# Patient Record
Sex: Female | Born: 1965 | Race: White | Hispanic: No | Marital: Married | Smoking: Never smoker
Health system: Southern US, Community
[De-identification: ages and names within clinical notes are randomized; demographics above are authoritative.]

## PROBLEM LIST (undated history)

## (undated) DIAGNOSIS — N8003 Adenomyosis of the uterus: Secondary | ICD-10-CM

## (undated) DIAGNOSIS — N800 Endometriosis of uterus: Secondary | ICD-10-CM

## (undated) DIAGNOSIS — E079 Disorder of thyroid, unspecified: Secondary | ICD-10-CM

## (undated) DIAGNOSIS — N809 Endometriosis, unspecified: Secondary | ICD-10-CM

## (undated) DIAGNOSIS — E785 Hyperlipidemia, unspecified: Secondary | ICD-10-CM

## (undated) DIAGNOSIS — T7840XA Allergy, unspecified, initial encounter: Secondary | ICD-10-CM

## (undated) HISTORY — DX: Adenomyosis of the uterus: N80.03

## (undated) HISTORY — DX: Allergy, unspecified, initial encounter: T78.40XA

## (undated) HISTORY — PX: LAPAROSCOPY: SHX197

## (undated) HISTORY — DX: Disorder of thyroid, unspecified: E07.9

## (undated) HISTORY — PX: OTHER SURGICAL HISTORY: SHX169

## (undated) HISTORY — DX: Endometriosis, unspecified: N80.9

## (undated) HISTORY — DX: Hyperlipidemia, unspecified: E78.5

## (undated) HISTORY — DX: Endometriosis of uterus: N80.0

## (undated) HISTORY — PX: VAGINAL HYSTERECTOMY: SUR661

## (undated) HISTORY — PX: BLADDER SUSPENSION: SHX72

---

## 2013-11-16 ENCOUNTER — Ambulatory Visit (INDEPENDENT_AMBULATORY_CARE_PROVIDER_SITE_OTHER): Payer: 59 | Admitting: Obstetrics & Gynecology

## 2013-11-16 ENCOUNTER — Encounter: Payer: Self-pay | Admitting: Obstetrics & Gynecology

## 2013-11-16 VITALS — BP 132/78 | HR 66 | Resp 16 | Ht 70.0 in | Wt 219.0 lb

## 2013-11-16 DIAGNOSIS — E063 Autoimmune thyroiditis: Secondary | ICD-10-CM

## 2013-11-16 DIAGNOSIS — Z01419 Encounter for gynecological examination (general) (routine) without abnormal findings: Secondary | ICD-10-CM

## 2013-11-16 DIAGNOSIS — Z0000 Encounter for general adult medical examination without abnormal findings: Secondary | ICD-10-CM

## 2013-11-16 NOTE — Progress Notes (Signed)
Subjective:    Katrina Clements is a 48 y.o. MW G2P2 (40 and 103  Daughters) female who presents for an annual exam. The patient has no complaints today. The patient is sexually active but not often due to vaginal dryness and some discomfort/abd pain after sex) GYN screening history: last pap: was normal. The patient wears seatbelts: yes. The patient participates in regular exercise: no. Has the patient ever been transfused or tattooed?: no. The patient reports that there is not domestic violence in her life.   Menstrual History: OB History   Grav Para Term Preterm Abortions TAB SAB Ect Mult Living   2 2 2       2       Menarche age: 46  No LMP recorded. Patient has had a hysterectomy.    The following portions of the patient's history were reviewed and updated as appropriate: allergies, current medications, past family history, past medical history, past social history, past surgical history and problem list.  Review of Systems A comprehensive review of systems was negative. Married for 25 years. Homemaker. She used the Viv[employer] dot for about 5 years post surgical menopause.   Objective:    BP 132/78  Pulse 66  Resp 16  Ht 5\' 10"  (1.778 m)  Wt 219 lb (99.338 kg)  BMI 31.42 kg/m2  General Appearance:    Alert, cooperative, no distress, appears stated age  Head:    Normocephalic, without obvious abnormality, atraumatic  Eyes:    PERRL, conjunctiva/corneas clear, EOM's intact, fundi    benign, both eyes  Ears:    Normal TM's and external ear canals, both ears  Nose:   Nares normal, septum midline, mucosa normal, no drainage    or sinus tenderness  Throat:   Lips, mucosa, and tongue normal; teeth and gums normal  Neck:   Supple, symmetrical, trachea midline, no adenopathy;    thyroid:  no enlargement/tenderness/nodules; no carotid   bruit or JVD  Back:     Symmetric, no curvature, ROM normal, no CVA tenderness  Lungs:     Clear to auscultation bilaterally, respirations unlabored  Chest  Wall:    No tenderness or deformity   Heart:    Regular rate and rhythm, S1 and S2 normal, no murmur, rub   or gallop  Breast Exam:    No tenderness, masses, or nipple abnormality  Abdomen:     Soft, non-tender, bowel sounds active all four quadrants,    no masses, no organomegaly  Genitalia:    Normal female without lesion, discharge or tenderness, normal cuff. Normal bimanual.     Extremities:   Extremities normal, atraumatic, no cyanosis or edema  Pulses:   2+ and symmetric all extremities  Skin:   Skin color, texture, turgor normal, no rashes or lesions  Lymph nodes:   Cervical, supraclavicular, and axillary nodes normal  Neurologic:   CNII-XII intact, normal strength, sensation and reflexes    throughout  .    Assessment:    Healthy female exam.    Plan:     Breast self exam technique reviewed and patient encouraged to perform self-exam monthly. Mammogram.  Fasting labs at her convenience

## 2013-11-24 ENCOUNTER — Other Ambulatory Visit: Payer: 59

## 2013-11-24 LAB — CBC
HEMATOCRIT: 38 % (ref 36.0–46.0)
HEMOGLOBIN: 12.5 g/dL (ref 12.0–15.0)
MCH: 28.1 pg (ref 26.0–34.0)
MCHC: 32.9 g/dL (ref 30.0–36.0)
MCV: 85.4 fL (ref 78.0–100.0)
Platelets: 264 10*3/uL (ref 150–400)
RBC: 4.45 MIL/uL (ref 3.87–5.11)
RDW: 14.2 % (ref 11.5–15.5)
WBC: 7.4 10*3/uL (ref 4.0–10.5)

## 2013-11-25 LAB — LIPID PANEL
CHOLESTEROL: 234 mg/dL — AB (ref 0–200)
HDL: 39 mg/dL — ABNORMAL LOW (ref >39)
LDL Cholesterol: 144 mg/dL — ABNORMAL HIGH (ref 0–99)
TRIGLYCERIDES: 257 mg/dL — AB (ref <150)
Total CHOL/HDL Ratio: 6 Ratio
VLDL: 51 mg/dL — ABNORMAL HIGH (ref 0–40)

## 2013-11-25 LAB — T4: T4, Total: 5.9 ug/dL (ref 5.0–12.5)

## 2013-11-25 LAB — T3, FREE: T3, Free: 3 pg/mL (ref 2.3–4.2)

## 2013-11-25 LAB — COMPREHENSIVE METABOLIC PANEL
ALT: 19 U/L (ref 0–35)
AST: 17 U/L (ref 0–37)
Albumin: 4 g/dL (ref 3.5–5.2)
Alkaline Phosphatase: 72 U/L (ref 39–117)
BILIRUBIN TOTAL: 0.4 mg/dL (ref 0.2–1.2)
BUN: 12 mg/dL (ref 6–23)
CO2: 27 meq/L (ref 19–32)
Calcium: 9 mg/dL (ref 8.4–10.5)
Chloride: 102 mEq/L (ref 96–112)
Creat: 0.74 mg/dL (ref 0.50–1.10)
GLUCOSE: 91 mg/dL (ref 70–99)
Potassium: 4.6 mEq/L (ref 3.5–5.3)
Sodium: 138 mEq/L (ref 135–145)
TOTAL PROTEIN: 7.1 g/dL (ref 6.0–8.3)

## 2013-11-25 LAB — TSH: TSH: 31.513 u[IU]/mL — AB (ref 0.350–4.500)

## 2013-11-27 ENCOUNTER — Telehealth: Payer: Self-pay | Admitting: *Deleted

## 2013-11-27 NOTE — Telephone Encounter (Signed)
Pt notified of labs results and patient will follow up with Dr. Ileene Rubens in [facility].  Copy mailed to pt.

## 2013-12-08 ENCOUNTER — Encounter: Payer: Self-pay | Admitting: Family Medicine

## 2013-12-08 ENCOUNTER — Ambulatory Visit (INDEPENDENT_AMBULATORY_CARE_PROVIDER_SITE_OTHER): Payer: 59 | Admitting: Family Medicine

## 2013-12-08 VITALS — BP 137/90 | HR 81 | Ht 70.0 in | Wt 222.0 lb

## 2013-12-08 DIAGNOSIS — Z23 Encounter for immunization: Secondary | ICD-10-CM

## 2013-12-08 DIAGNOSIS — E785 Hyperlipidemia, unspecified: Secondary | ICD-10-CM | POA: Insufficient documentation

## 2013-12-08 DIAGNOSIS — E038 Other specified hypothyroidism: Secondary | ICD-10-CM

## 2013-12-08 DIAGNOSIS — M797 Fibromyalgia: Secondary | ICD-10-CM | POA: Insufficient documentation

## 2013-12-08 DIAGNOSIS — E063 Autoimmune thyroiditis: Secondary | ICD-10-CM

## 2013-12-08 DIAGNOSIS — IMO0001 Reserved for inherently not codable concepts without codable children: Secondary | ICD-10-CM

## 2013-12-08 MED ORDER — LEVOTHYROXINE SODIUM 75 MCG PO TABS: 75.0000 ug | ORAL_TABLET | Freq: Every day | ORAL | Status: DC

## 2013-12-08 MED ORDER — PREGABALIN 75 MG PO CAPS: 75.0000 mg | ORAL_CAPSULE | Freq: Two times a day (BID) | ORAL | Status: DC

## 2013-12-08 NOTE — Patient Instructions (Signed)
Please call me if you would like a Rx for the Lyrica after trying the sample packs.

## 2013-12-08 NOTE — Progress Notes (Signed)
CC: Katrina Clements is a 48 y.o. female is here for Establish Care   Subjective: HPI:  Pleasant 48 year old here to establish care  Patient reports a history of Hashimoto's thyroiditis. She's been on Synthroid and a compound formulation of thyroid hormone. She's not been taking either for matter of months due to finances and the needs of her children. She endorses unintentional weight gain, fatigue, and diffuse muscle and skin pain described as tenderness and sensitivity that she attributes to her fibromyalgia. Recently had a TSH that was elevated with normal T4 and T3.  She recently had labs done reflecting hyperlipidemia. She's never been on cholesterol medication there is no formal exercise routine but she has cut out all fried foods in her diet over the past 3-6 months.  Reports a history of fibromyalgia which she has been unsuccessfully trying to control with a gluten-free diet but has never been on medications. Symptoms are worse first thing in the morning or after periods of inactivity. She denies joint pain but attributes the pain more to skin and muscles mostly in the upper extremities. Symptoms are mild-to-moderate in severity and slightly improved with Excedrin.  Review of Systems - General ROS: negative for - chills, fever, night sweats,weight loss Ophthalmic ROS: negative for - decreased vision Psychological ROS: negative for - anxiety or depression ENT ROS: negative for - hearing change, nasal congestion, tinnitus or allergies Hematological and Lymphatic ROS: negative for - bleeding problems, bruising or swollen lymph nodes Breast ROS: negative Respiratory ROS: no cough, shortness of breath, or wheezing Cardiovascular ROS: no chest pain or dyspnea on exertion Gastrointestinal ROS: no abdominal pain, change in bowel habits, or black or bloody stools Genito-Urinary ROS: negative for - genital discharge, genital ulcers, incontinence or abnormal bleeding from genitals Musculoskeletal  ROS: negative for - joint pain Neurological ROS: negative for - headaches or memory loss Dermatological ROS: negative for lumps, mole changes, rash and skin lesion changes Past Medical History  Diagnosis Date  . Hyperlipidemia   . Thyroid disease   . Allergy     seasonal  . Endometriosis   . Adenomyosis     Past Surgical History  Procedure Laterality Date  . Vaginal hysterectomy      total  . Laparoscopy      x 2  . Bladder suspension    . Btl     Family History  Problem Relation Age of Onset  . Heart attack Mother   . Rheum arthritis Mother   . Heart attack Maternal Grandmother   . Stroke Maternal Grandfather   . Heart attack Brother   . Hypertension Brother   . Diabetes Father   . Cancer Father     prostate  . Diabetes Sister     History   Social History  . Marital Status: Married    Spouse Name: N/A    Number of Children: N/A  . Years of Education: N/A   Occupational History  . Not on file.   Social History Main Topics  . Smoking status: Never Smoker   . Smokeless tobacco: Never Used  . Alcohol Use: Yes     Comment: very rarely  . Drug Use: No  . Sexual Activity: Yes    Partners: Male   Other Topics Concern  . Not on file   Social History Narrative  . No narrative on file     Objective: BP 137/90  Pulse 81  Ht 5\' 10"  (1.778 m)  Wt 222 lb (100.699 kg)  BMI 31.85 kg/m2  General: Alert and Oriented, No Acute Distress HEENT: Pupils equal, round, reactive to light. Conjunctivae clear.  Moist mucous membranes pharynx unremarkable Lungs: Clear to auscultation bilaterally, no wheezing/ronchi/rales.  Comfortable work of breathing. Good air movement. Cardiac: Regular rate and rhythm. Normal S1/S2.  No murmurs, rubs, nor gallops.   Abdomen: obese and soft Extremities: No peripheral edema.  Strong peripheral pulses.  Mental Status: No depression, anxiety, nor agitation. Skin: Warm and dry.  Assessment & Plan: Katrina Clements was seen today for establish  care.  Diagnoses and associated orders for this visit:  Hypothyroidism due to Hashimoto's thyroiditis - levothyroxine (SYNTHROID, LEVOTHROID) 75 MCG tablet; Take 1 tablet (75 mcg total) by mouth daily. - Flu Vaccine QUAD 36+ mos PF IM (Fluarix Quad PF)  Hyperlipidemia  Fibromyalgia - pregabalin (LYRICA) 75 MG capsule; Take 1 capsule (75 mg total) by mouth 2 (two) times daily. - Flu Vaccine QUAD 36+ mos PF IM (Fluarix Quad PF)  Need for immunization against influenza - Flu Vaccine QUAD 36+ mos PF IM (Fluarix Quad PF)    Hypothyroidism: Uncontrolled chronic condition, restart levothyroxine with repeat TSH in 3 months Hyperlipidemia: Discussed with her 10 year risk of having a cardiovascular event is approximately 2.3% and does not warrant cholesterol-lowering medication at this time. Discussed I do expect her cholesterol to improve with better control of her hypothyroidism. This can be improved with engaging in 30-45 minutes of moderate exercise most days of the week. Fibromyalgia: Discussed Lyrica versus Cymbalta and their side effects, she would prefer to try Lyrica. I've asked her to call me if she gets benefit and would like to have a formal prescription  Return in about 3 months (around 03/10/2014) for Thyroid Function.

## 2014-02-19 ENCOUNTER — Encounter: Payer: Self-pay | Admitting: Family Medicine

## 2014-03-12 ENCOUNTER — Ambulatory Visit: Payer: 59 | Admitting: Family Medicine

## 2014-03-26 ENCOUNTER — Ambulatory Visit (INDEPENDENT_AMBULATORY_CARE_PROVIDER_SITE_OTHER): Payer: 59 | Admitting: Family Medicine

## 2014-03-26 ENCOUNTER — Encounter: Payer: Self-pay | Admitting: Family Medicine

## 2014-03-26 VITALS — BP 135/80 | HR 70 | Ht 70.0 in | Wt 227.0 lb

## 2014-03-26 DIAGNOSIS — M797 Fibromyalgia: Secondary | ICD-10-CM

## 2014-03-26 DIAGNOSIS — E063 Autoimmune thyroiditis: Secondary | ICD-10-CM

## 2014-03-26 DIAGNOSIS — E038 Other specified hypothyroidism: Secondary | ICD-10-CM

## 2014-03-26 DIAGNOSIS — Z23 Encounter for immunization: Secondary | ICD-10-CM

## 2014-03-26 DIAGNOSIS — R300 Dysuria: Secondary | ICD-10-CM

## 2014-03-26 LAB — POCT URINALYSIS DIPSTICK
Bilirubin, UA: NEGATIVE
Glucose, UA: NEGATIVE
Ketones, UA: NEGATIVE
Leukocytes, UA: NEGATIVE
Nitrite, UA: NEGATIVE
PH UA: 6
PROTEIN UA: NEGATIVE
Spec Grav, UA: <=1.005
Urobilinogen, UA: 0.2

## 2014-03-26 LAB — TSH: TSH: 7.753 u[IU]/mL — AB (ref 0.350–4.500)

## 2014-03-26 MED ORDER — GABAPENTIN 300 MG PO CAPS: ORAL_CAPSULE | ORAL | Status: DC

## 2014-03-26 NOTE — Addendum Note (Signed)
Addended by: Margette Fast D on: 03/26/2014 03:13 PM   Modules accepted: Orders

## 2014-03-26 NOTE — Progress Notes (Signed)
CC: Katrina Clements is a 48 y.o. female is here for Follow-up and Heartburn   Subjective: HPI:  Follow-up hypothyroidism: Since I saw her last she began levothyroxine 75 g on a daily basis. She believes this is giving her energy without any known side effects. She's been taking on a daily basis. She's had about 5-7 pounds of unintentional weight gain. She denies any gastrointestinal complaints other than reflux described below.  Follow-up fibromyalgia: While taking Lyrica 1 dose a day she had drastic improvement of her diffuse fibromyalgia and muscle sensitivity. She said that she was much more functional and felt like she no longer had any fibromyalgia. Unfortunately she developed reflux that would wake her in the middle of the night while taking even only a single dose. Since stopping this medication her reflux has gone away completely. She denies any abdominal pain constipation or diarrhea currently. She is back to having moderate muscle sensitivity diffusely through her body without any motor or sensory disturbances  Complains of mild dysuria has been present off and on for a few weeks now. Nothing particularly makes it better or worse. She's tried to increase fluids to flush her system but no benefit as of yet.   Review Of Systems Outlined In HPI  Past Medical History  Diagnosis Date  . Hyperlipidemia   . Thyroid disease   . Allergy     seasonal  . Endometriosis   . Adenomyosis     Past Surgical History  Procedure Laterality Date  . Vaginal hysterectomy      total  . Laparoscopy      x 2  . Bladder suspension    . Btl     Family History  Problem Relation Age of Onset  . Heart attack Mother   . Rheum arthritis Mother   . Heart attack Maternal Grandmother   . Stroke Maternal Grandfather   . Heart attack Brother   . Hypertension Brother   . Diabetes Father   . Cancer Father     prostate  . Diabetes Sister     History   Social History  . Marital Status: Married   Spouse Name: N/A    Number of Children: N/A  . Years of Education: N/A   Occupational History  . Not on file.   Social History Main Topics  . Smoking status: Never Smoker   . Smokeless tobacco: Never Used  . Alcohol Use: Yes     Comment: very rarely  . Drug Use: No  . Sexual Activity:    Partners: Male   Other Topics Concern  . Not on file   Social History Narrative     Objective: BP 135/80 mmHg  Pulse 70  Ht 5\' 10"  (1.778 m)  Wt 227 lb (102.967 kg)  BMI 32.57 kg/m2  SpO2 99%  Vital signs reviewed. General: Alert and Oriented, No Acute Distress HEENT: Pupils equal, round, reactive to light. Conjunctivae clear.  External ears unremarkable.  Moist mucous membranes. Lungs: Clear and comfortable work of breathing, speaking in full sentences without accessory muscle use. Cardiac: Regular rate and rhythm.  Neuro: CN II-XII grossly intact, gait normal. Extremities: No peripheral edema.  Strong peripheral pulses.  Mental Status: No depression, anxiety, nor agitation. Logical though process. Skin: Warm and dry.  Assessment & Plan: Chrystina was seen today for follow-up and heartburn.  Diagnoses and associated orders for this visit:  Hypothyroidism due to Hashimoto's thyroiditis - TSH  Fibromyalgia - gabapentin (NEURONTIN) 300 MG capsule; One by  mouth before bed, may take up to three times a day as needed for fibromyalgia.  Dysuria - Urine culture    Hypothyroidism: Clinically improving, checking TSH today continue levothyroxine 75 g pending results Fibromyalgia: Improved on Lyrica however intolerable side effects. Begin low-dose gabapentin and taper up as tolerated. If no benefit discussed that Cymbalta would be the next option. Dysuria: Checking urinalysis and culture.  Ultimate follow-up will be based on TSH results.   Return in about 6 months (around 09/25/2014).

## 2014-03-26 NOTE — Addendum Note (Signed)
Addended by: Margette Fast D on: 03/26/2014 09:59 AM   Modules accepted: Orders

## 2014-03-27 ENCOUNTER — Telehealth: Payer: Self-pay | Admitting: Family Medicine

## 2014-03-27 DIAGNOSIS — E063 Autoimmune thyroiditis: Principal | ICD-10-CM

## 2014-03-27 DIAGNOSIS — E038 Other specified hypothyroidism: Secondary | ICD-10-CM

## 2014-03-27 MED ORDER — LEVOTHYROXINE SODIUM 88 MCG PO TABS
88.0000 ug | ORAL_TABLET | Freq: Every day | ORAL | Status: DC
Start: 2014-03-27 — End: 2014-08-13

## 2014-03-27 NOTE — Telephone Encounter (Signed)
Seth Bake, Will you please let patient know that her thyroid supplement appears to be barely under dosed therefore I'd recommend increasing the dose to 74mcg daily. I have sent an Rx to her [facility].  We'll want to recheck her TSH in 3 months.

## 2014-03-27 NOTE — Telephone Encounter (Signed)
Message left on vm 

## 2014-03-29 ENCOUNTER — Telehealth: Payer: Self-pay | Admitting: Family Medicine

## 2014-03-29 LAB — URINE CULTURE: Colony Count: >=100000

## 2014-03-29 MED ORDER — CIPROFLOXACIN HCL 250 MG PO TABS: ORAL_TABLET | ORAL | Status: AC

## 2014-03-29 NOTE — Telephone Encounter (Signed)
Patient notified about urine culture showing Escherichia coli UTI, Cipro has been sent.

## 2014-08-03 ENCOUNTER — Telehealth: Payer: Self-pay | Admitting: *Deleted

## 2014-08-03 DIAGNOSIS — E039 Hypothyroidism, unspecified: Secondary | ICD-10-CM

## 2014-08-03 NOTE — Telephone Encounter (Signed)
Labs ordered to recheck TSH & faxed to [facility].  Pt notified

## 2014-08-10 LAB — TSH: TSH: 4.848 u[IU]/mL — AB (ref 0.350–4.500)

## 2014-08-13 ENCOUNTER — Telehealth: Payer: Self-pay | Admitting: Family Medicine

## 2014-08-13 ENCOUNTER — Ambulatory Visit (INDEPENDENT_AMBULATORY_CARE_PROVIDER_SITE_OTHER): Payer: 59 | Admitting: Family Medicine

## 2014-08-13 ENCOUNTER — Encounter: Payer: Self-pay | Admitting: Family Medicine

## 2014-08-13 VITALS — BP 127/83 | HR 79 | Wt 225.0 lb

## 2014-08-13 DIAGNOSIS — R300 Dysuria: Secondary | ICD-10-CM

## 2014-08-13 DIAGNOSIS — M797 Fibromyalgia: Secondary | ICD-10-CM

## 2014-08-13 DIAGNOSIS — N390 Urinary tract infection, site not specified: Secondary | ICD-10-CM

## 2014-08-13 DIAGNOSIS — E063 Autoimmune thyroiditis: Secondary | ICD-10-CM

## 2014-08-13 DIAGNOSIS — E038 Other specified hypothyroidism: Secondary | ICD-10-CM

## 2014-08-13 LAB — POCT URINALYSIS DIPSTICK
Bilirubin, UA: NEGATIVE
Blood, UA: NEGATIVE
Glucose, UA: NEGATIVE
Ketones, UA: NEGATIVE
Nitrite, UA: POSITIVE
Protein, UA: NEGATIVE
Spec Grav, UA: 1.02
Urobilinogen, UA: 0.2
pH, UA: 7.5

## 2014-08-13 MED ORDER — LEVOTHYROXINE SODIUM 100 MCG PO TABS: 100.0000 ug | ORAL_TABLET | Freq: Every day | ORAL | Status: DC

## 2014-08-13 MED ORDER — DULOXETINE HCL 20 MG PO CPEP: 20.0000 mg | ORAL_CAPSULE | Freq: Every day | ORAL | Status: DC

## 2014-08-13 MED ORDER — SULFAMETHOXAZOLE-TRIMETHOPRIM 800-160 MG PO TABS: 1.0000 | ORAL_TABLET | Freq: Two times a day (BID) | ORAL | Status: DC

## 2014-08-13 NOTE — Telephone Encounter (Signed)
Opened in error

## 2014-08-13 NOTE — Progress Notes (Signed)
CC: Katrina Clements is a 49 y.o. female is here for f/u thyroid and Urinary Tract Infection   Subjective: HPI:  For the last week she's been experiencing dysuria, frequency and urgency that has not improved to the sole intervention of increased fluid intake and decreased caffeine consumption. Symptoms feel like prior episodes with urinary tract infections. She's not been on any antibiotics since I gave her ciprofloxacin 4 months ago. She denies flank pain fevers, chills, nausea or abdominal pain.  She wants something she can take for fibromyalgia. Lyrica and gabapentin were very beneficial however caused her to have intolerable reflux. She complains her fiber myalgias currently mild in severity affecting her less than most days of the week seems to be worse first thing in the morning. Described as diffuse muscle aching and tenderness. Other than above nothing has made it better or worse she knows of.  Follow-up hyperthyroidism: No unintentional weight gain or loss. No change in energy level. No new fatigue. Denies any new skin or hair changes. Currently taking 88 g of levothyroxine on a daily basis.   Review Of Systems Outlined In HPI  Past Medical History  Diagnosis Date  . Hyperlipidemia   . Thyroid disease   . Allergy     seasonal  . Endometriosis   . Adenomyosis     Past Surgical History  Procedure Laterality Date  . Vaginal hysterectomy      total  . Laparoscopy      x 2  . Bladder suspension    . Btl     Family History  Problem Relation Age of Onset  . Heart attack Mother   . Rheum arthritis Mother   . Heart attack Maternal Grandmother   . Stroke Maternal Grandfather   . Heart attack Brother   . Hypertension Brother   . Diabetes Father   . Cancer Father     prostate  . Diabetes Sister     History   Social History  . Marital Status: Married    Spouse Name: N/A  . Number of Children: N/A  . Years of Education: N/A   Occupational History  . Not on file.    Social History Main Topics  . Smoking status: Never Smoker   . Smokeless tobacco: Never Used  . Alcohol Use: Yes     Comment: very rarely  . Drug Use: No  . Sexual Activity:    Partners: Male   Other Topics Concern  . Not on file   Social History Narrative     Objective: BP 127/83 mmHg  Pulse 79  Wt 225 lb (102.059 kg)  General: Alert and Oriented, No Acute Distress HEENT: Pupils equal, round, reactive to light. Conjunctivae clear.  Moist mucous membranes Lungs: Clear to auscultation bilaterally, no wheezing/ronchi/rales.  Comfortable work of breathing. Good air movement. Cardiac: Regular rate and rhythm. Normal S1/S2.  No murmurs, rubs, nor gallops.   Abdomen: Mild obesity Extremities: No peripheral edema.  Strong peripheral pulses.  Mental Status: No depression, anxiety, nor agitation. Skin: Warm and dry.  Assessment & Plan: Casmira was seen today for f/u thyroid and urinary tract infection.  Diagnoses and all orders for this visit:  Dysuria Orders: -     Urinalysis Dipstick -     Urine culture  Hypothyroidism due to Hashimoto's thyroiditis Orders: -     levothyroxine (SYNTHROID, LEVOTHROID) 100 MCG tablet; Take 1 tablet (100 mcg total) by mouth daily.  Fibromyalgia Orders: -     DULoxetine (CYMBALTA)  20 MG capsule; Take 1 capsule (20 mg total) by mouth daily.  UTI (lower urinary tract infection) Orders: -     sulfamethoxazole-trimethoprim (BACTRIM DS,SEPTRA DS) 800-160 MG per tablet; Take 1 tablet by mouth 2 (two) times daily.   Dysuria: Urinalysis highly suggestive of urinary tract infection therefore start Bactrim pending urine culture Hypothyroidism: TSH mildly elevated last week, uncontrolled chronic condition increasing levothyroxine. Fibromyalgia. She is interested in trying medications other than Lyrica or gabapentin to help with fibromyalgia which is mildly uncontrolled. Recommended Cymbalta  Return in about 3 months (around 11/12/2014) for Thyroid  function. Marland Kitchen

## 2014-08-13 NOTE — Telephone Encounter (Deleted)
Seth Bake, Can you please ask patient if she's been scheduled for a breast biopsy? The radiologist reading her mammogram recommended a biopsy and I want to ensure this doesn't fall through the cracks.

## 2014-08-16 ENCOUNTER — Telehealth: Payer: Self-pay | Admitting: Family Medicine

## 2014-08-16 LAB — URINE CULTURE: Colony Count: >=100000

## 2014-08-16 MED ORDER — AMOXICILLIN-POT CLAVULANATE 500-125 MG PO TABS: ORAL_TABLET | ORAL | Status: AC

## 2014-08-16 NOTE — Telephone Encounter (Signed)
Seth Bake, Will you please let patient know that her urine culture revealed that the antibiotic given to her on the 25th is not going to fully kill the bacteria causing her UTI.  I'd recommend starting a Rx of augmentin that I've sent to [facility] in [place] which should work based on her urine culture results.

## 2014-08-17 NOTE — Telephone Encounter (Signed)
Pt.notified

## 2014-09-03 ENCOUNTER — Telehealth: Payer: Self-pay

## 2014-09-03 MED ORDER — NITROFURANTOIN MONOHYD MACRO 100 MG PO CAPS: 100.0000 mg | ORAL_CAPSULE | Freq: Two times a day (BID) | ORAL | Status: AC

## 2014-09-03 NOTE — Telephone Encounter (Signed)
Sure, Augmentin 500-125 mg take one every 8 hours for ten days. It is on the telephone note from 08/16/2014.

## 2014-09-03 NOTE — Telephone Encounter (Signed)
Sorry Katrina Clements, I was confused thinking that she would have already finished the ten days if she called over sixteen days after it was Rxed.  My mistake! New Rx of macrobid sent to [facility] in [place].

## 2014-09-03 NOTE — Telephone Encounter (Signed)
Katrina Clements is having an allergic reaction to Augmentin. She has hives on her scalp, under her breast and between her legs. She has taken Benadryl 50 mg for the hives. Can she have a different antibiotic? She cannot come in today because her kids have appointments all day. Please advise.

## 2014-09-03 NOTE — Telephone Encounter (Signed)
Yes but can she tell me what Augmentin was Rxed for since I don't see any documentation of this Rx.

## 2014-09-04 ENCOUNTER — Encounter: Payer: Self-pay | Admitting: *Deleted

## 2014-09-04 ENCOUNTER — Emergency Department (INDEPENDENT_AMBULATORY_CARE_PROVIDER_SITE_OTHER)
Admission: EM | Admit: 2014-09-04 | Discharge: 2014-09-04 | Disposition: A | Discharge status: Home / Self Care | Payer: 59 | Source: Home / Self Care | Attending: Emergency Medicine | Admitting: Emergency Medicine

## 2014-09-04 DIAGNOSIS — L500 Allergic urticaria: Secondary | ICD-10-CM | POA: Diagnosis not present

## 2014-09-04 MED ORDER — HYDROXYZINE HCL 10 MG PO TABS
10.0000 mg | ORAL_TABLET | ORAL | Status: AC
  Administered 2014-09-04: 10 mg via ORAL

## 2014-09-04 MED ORDER — PREDNISONE 20 MG PO TABS: 20.0000 mg | ORAL_TABLET | Freq: Two times a day (BID) | ORAL | Status: DC

## 2014-09-04 MED ORDER — HYDROXYZINE HCL 10 MG PO TABS: ORAL_TABLET | ORAL | Status: DC

## 2014-09-04 MED ORDER — METHYLPREDNISOLONE SODIUM SUCC 125 MG IJ SOLR
125.0000 mg | INTRAMUSCULAR | Status: AC
  Administered 2014-09-04: 125 mg via INTRAMUSCULAR

## 2014-09-04 NOTE — ED Provider Notes (Addendum)
CSN: [id number]     Arrival date & time 09/04/14  1137 History   First MD Initiated Contact with Patient 09/04/14 [facility] Chief Complaint  Patient presents with  . Urticaria   HPI Pt c/o hives all over after taking Augmentin the past 2 weeks. (She did not know about, and never had filled the Macrobid that was called in by Dr. Lajoyce Lauber office yesterday)  --she also mentions that she had taken regular amoxicillin over the years, in the past, without problems.  She has taken Alavert and Benadryl with no relief.--Last dose of Benadryl was last night Complains of severe itchy hives all over. Feels a fullness in her neck but no dysphasia or breathing problems. No tongue swelling. Denies fever or chills.  Denies chest pain or shortness of breath. No nausea vomiting or abdominal pain.  She denies any insect bite. She does not recall any other possible allergens Past Medical History  Diagnosis Date  . Hyperlipidemia   . Thyroid disease   . Allergy     seasonal  . Endometriosis   . Adenomyosis    Past Surgical History  Procedure Laterality Date  . Vaginal hysterectomy      total  . Laparoscopy      x 2  . Bladder suspension    . Btl     Family History  Problem Relation Age of Onset  . Heart attack Mother   . Rheum arthritis Mother   . Heart attack Maternal Grandmother   . Stroke Maternal Grandfather   . Heart attack Brother   . Hypertension Brother   . Diabetes Father   . Cancer Father     prostate  . Diabetes Sister    History  Substance Use Topics  . Smoking status: Never Smoker   . Smokeless tobacco: Never Used  . Alcohol Use: Yes     Comment: very rarely   OB History    Gravida Para Term Preterm AB TAB SAB Ectopic Multiple Living   2 2 2       2      Review of Systems Remainder of Review of Systems negative for acute change except as noted in the HPI.  Allergies  Augmentin; Azithromycin; Gabapentin; Lyrica; and Keflex  Home Medications    Prior to Admission medications   Medication Sig Start Date End Date Taking? Authorizing Provider  DULoxetine (CYMBALTA) 20 MG capsule Take 1 capsule (20 mg total) by mouth daily. 08/13/14   Marcial Pacas, DO  hydrOXYzine (ATARAX/VISTARIL) 10 MG tablet Take 2 or 3 every 6 hrs as needed for itch .May cause drowsiness at higher doses 09/04/14   Jacqulyn Cane, MD  levothyroxine (SYNTHROID, LEVOTHROID) 100 MCG tablet Take 1 tablet (100 mcg total) by mouth daily. 08/13/14   Marcial Pacas, DO  nitrofurantoin, macrocrystal-monohydrate, (MACROBID) 100 MG capsule Take 1 capsule (100 mg total) by mouth 2 (two) times daily. 09/03/14 09/13/14  Marcial Pacas, DO  predniSONE (DELTASONE) 20 MG tablet Take 1 tablet (20 mg total) by mouth 2 (two) times daily with a meal. X 5 days 09/04/14   Jacqulyn Cane, MD   BP 136/84 mmHg  Pulse 89  Temp(Src) 98 F (36.7 C) (Oral)  Resp 18  Ht 5\' 10"  (1.778 m)  Wt 223 lb (101.152 kg)  BMI 32.00 kg/m2  SpO2 100% Physical Exam  Constitutional: She is oriented to person, place, and time. She appears well-developed and well-nourished. No distress.  HENT:  Head: Normocephalic and  atraumatic.  Mouth/Throat: Oropharynx is clear and moist and mucous membranes are normal. No oropharyngeal exudate, posterior oropharyngeal edema or posterior oropharyngeal erythema.  Airway intact  Eyes: Conjunctivae and EOM are normal. Pupils are equal, round, and reactive to light. No scleral icterus.  Neck: Normal range of motion. Neck supple. No JVD present.  Cardiovascular: Normal rate.   Pulmonary/Chest: Effort normal and breath sounds normal. No stridor. No respiratory distress. She has no wheezes.  Abdominal: She exhibits no distension.  Musculoskeletal: Normal range of motion.  Lymphadenopathy:    She has no cervical adenopathy.  Neurological: She is alert and oriented to person, place, and time.  Skin: Skin is warm. Rash noted.  Blotchy, erythematous, blanching, erythematous urticarial rash  diffusely on neck face trunk and arms and legs  Psychiatric: She has a normal mood and affect.  Nursing note and vitals reviewed.   Urgent Care Course  12:00 PM Although I advised a shot of Benadryl now, She declined any Benadryl here in urgent care, because she states that causes drowsiness for her.  She states she drove here and she has no one to drive her home.  After risks benefits alternatives discussed, patient agreed to the following: Solu-Medrol 125 mg IM stat given. Hydroxyzine 10 mg by mouth stat given   Procedures (including critical care time) Labs Review Labs Reviewed - No data to display   MDM   1. Allergic urticaria    See details of urgent care course, above. 12:37 PM--patient states that she is significantly improving. Less itch. I reexamined patient. Airway intact. Lungs clear. Vital signs stable. Still has urticarial rash, but it is gradually decreasing.  Further treatment options discussed, as well as risks, benefits, alternatives. Patient voiced understanding and agreement with the following plans: Discharge Medication List as of 09/04/2014 12:40 PM    START taking these medications   Details  hydrOXYzine (ATARAX/VISTARIL) 10 MG tablet Take 2 or 3 every 6 hrs as needed for itch .May cause drowsiness at higher doses, Normal    predniSONE (DELTASONE) 20 MG tablet Take 1 tablet (20 mg total) by mouth 2 (two) times daily with a meal. X 5 days, Starting 09/04/2014, Until Discontinued, Normal       Avoid amoxicillin or clavulanate in the future, or until that can be sorted out further by allergist  Follow-up with your primary care doctor in 3-4 days.Precautions discussed. Red flags discussed.--Emergency room if any red flag Questions invited and answered. Patient voiced understanding and agreement.        Jacqulyn Cane, MD 09/04/14 1245  Jacqulyn Cane, MD 09/04/14 [phone]

## 2014-09-04 NOTE — Telephone Encounter (Signed)
Left message for a return call

## 2014-09-04 NOTE — ED Notes (Signed)
Pt c/o hives all over after taking Macrobid. She has taken Alavert and Benadryl with no relief.

## 2015-02-26 ENCOUNTER — Telehealth: Payer: Self-pay

## 2015-02-26 DIAGNOSIS — E063 Autoimmune thyroiditis: Principal | ICD-10-CM

## 2015-02-26 DIAGNOSIS — E038 Other specified hypothyroidism: Secondary | ICD-10-CM

## 2015-02-26 NOTE — Telephone Encounter (Signed)
Pt.notified

## 2015-02-26 NOTE — Telephone Encounter (Signed)
TSH order in your in box, no need to fast.

## 2015-02-26 NOTE — Telephone Encounter (Signed)
Pt would like labs done before visit on Thursday.

## 2015-02-27 LAB — TSH: TSH: 9.39 u[IU]/mL — ABNORMAL HIGH (ref 0.350–4.500)

## 2015-02-28 ENCOUNTER — Ambulatory Visit (INDEPENDENT_AMBULATORY_CARE_PROVIDER_SITE_OTHER): Payer: 59 | Admitting: Family Medicine

## 2015-02-28 ENCOUNTER — Encounter: Payer: Self-pay | Admitting: Family Medicine

## 2015-02-28 VITALS — BP 138/78 | HR 62 | Wt 224.0 lb

## 2015-02-28 DIAGNOSIS — M797 Fibromyalgia: Secondary | ICD-10-CM | POA: Diagnosis not present

## 2015-02-28 DIAGNOSIS — E063 Autoimmune thyroiditis: Secondary | ICD-10-CM

## 2015-02-28 DIAGNOSIS — B070 Plantar wart: Secondary | ICD-10-CM | POA: Diagnosis not present

## 2015-02-28 DIAGNOSIS — E038 Other specified hypothyroidism: Secondary | ICD-10-CM

## 2015-02-28 MED ORDER — LEVOTHYROXINE SODIUM 112 MCG PO TABS: 112.0000 ug | ORAL_TABLET | Freq: Every day | ORAL | Status: DC

## 2015-02-28 NOTE — Progress Notes (Signed)
CC: Katrina Clements is a 49 y.o. female is here for Hypothyroidism   Subjective: HPI:  Follow-up hypothyroidism: Currently taking 100 mcg of levothyroxine. She notices that she is tired the day after a missed dose. She denies any other side effects. She's having difficulty losing weight despite dieting and exercising. She denies any gastrointestinal or skin complaints.  Follow-up fibromyalgia: She took duloxetine and felt tired much later in the day and is worried that this could be a side effect. She'll he took one dose of it. She wants to know if she should still take it. She still has daily aches and pains in the torso and in the arms but not localized to any joints.   She has a growth on the bottom of her right foot that she would like removed. It's been there for matter months and is enlarging. Nothing seems to make it better or worse. It's worse when bearing weight but does not cause any pain when not bearing weight.  Review Of Systems Outlined In HPI  Past Medical History  Diagnosis Date  . Hyperlipidemia   . Thyroid disease   . Allergy     seasonal  . Endometriosis   . Adenomyosis     Past Surgical History  Procedure Laterality Date  . Vaginal hysterectomy      total  . Laparoscopy      x 2  . Bladder suspension    . Btl     Family History  Problem Relation Age of Onset  . Heart attack Mother   . Rheum arthritis Mother   . Heart attack Maternal Grandmother   . Stroke Maternal Grandfather   . Heart attack Brother   . Hypertension Brother   . Diabetes Father   . Cancer Father     prostate  . Diabetes Sister     Social History   Social History  . Marital Status: Married    Spouse Name: N/A  . Number of Children: N/A  . Years of Education: N/A   Occupational History  . Not on file.   Social History Main Topics  . Smoking status: Never Smoker   . Smokeless tobacco: Never Used  . Alcohol Use: Yes     Comment: very rarely  . Drug Use: No  . Sexual Activity:     Partners: Male   Other Topics Concern  . Not on file   Social History Narrative     Objective: BP 138/78 mmHg  Pulse 62  Wt 224 lb (101.606 kg)  Vital signs reviewed. General: Alert and Oriented, No Acute Distress HEENT: Pupils equal, round, reactive to light. Conjunctivae clear.  External ears unremarkable.  Moist mucous membranes. Lungs: Clear and comfortable work of breathing, speaking in full sentences without accessory muscle use. Cardiac: Regular rate and rhythm.  Neuro: CN II-XII grossly intact, gait normal. Extremities: No peripheral edema.  Strong peripheral pulses. Has similar diameter plantar wart on the bottom of the right foot Mental Status: No depression, anxiety, nor agitation. Logical though process. Skin: Warm and dry.  Assessment & Plan: Hortencia was seen today for hypothyroidism.  Diagnoses and all orders for this visit:  Hypothyroidism due to Hashimoto's thyroiditis -     levothyroxine (SYNTHROID, LEVOTHROID) 112 MCG tablet; Take 1 tablet (112 mcg total) by mouth daily.  Plantar wart -     Ambulatory referral to Podiatry  Fibromyalgia   Hypothyroidism: Uncontrolled, TSH was elevated when checked yesterday, increasing levothyroxine. Plantar wart: Referral to dietary for  consideration of excision Fibromyalgia: Uncontrolled, start Cymbalta but take it at bedtime every night.   Return in about 3 months (around 05/31/2015) for Thyroid Follow Up.

## 2015-05-29 ENCOUNTER — Other Ambulatory Visit: Payer: Self-pay | Admitting: Family Medicine

## 2015-05-29 ENCOUNTER — Telehealth: Payer: Self-pay

## 2015-05-29 DIAGNOSIS — E785 Hyperlipidemia, unspecified: Secondary | ICD-10-CM

## 2015-05-29 DIAGNOSIS — E063 Autoimmune thyroiditis: Principal | ICD-10-CM

## 2015-05-29 DIAGNOSIS — E039 Hypothyroidism, unspecified: Secondary | ICD-10-CM

## 2015-05-29 DIAGNOSIS — E038 Other specified hypothyroidism: Secondary | ICD-10-CM

## 2015-05-29 LAB — TSH: TSH: 5.67 mIU/L — ABNORMAL HIGH

## 2015-05-29 NOTE — Telephone Encounter (Signed)
Informed other labs were added. Pt states she attempted to have labs drawn this a.m. but they were unable to obtain a sample states she will try in [facility] tomorrow and if they are unable to get it she will reschedule her appointment.

## 2015-05-29 NOTE — Telephone Encounter (Signed)
Fasting lipids and TSH,  In your in box

## 2015-05-29 NOTE — Telephone Encounter (Signed)
Besides a TSH draw is there any other labs you like for this pt to have drawn?

## 2015-05-31 ENCOUNTER — Ambulatory Visit (INDEPENDENT_AMBULATORY_CARE_PROVIDER_SITE_OTHER): Payer: 59 | Admitting: Family Medicine

## 2015-05-31 ENCOUNTER — Encounter: Payer: Self-pay | Admitting: Family Medicine

## 2015-05-31 VITALS — BP 135/74 | HR 63 | Wt 227.0 lb

## 2015-05-31 DIAGNOSIS — M797 Fibromyalgia: Secondary | ICD-10-CM

## 2015-05-31 DIAGNOSIS — E063 Autoimmune thyroiditis: Secondary | ICD-10-CM | POA: Diagnosis not present

## 2015-05-31 DIAGNOSIS — E038 Other specified hypothyroidism: Secondary | ICD-10-CM

## 2015-05-31 MED ORDER — LEVOTHYROXINE SODIUM 125 MCG PO TABS: 125.0000 ug | ORAL_TABLET | Freq: Every day | ORAL | Status: DC

## 2015-05-31 NOTE — Progress Notes (Signed)
CC: Katrina Clements is a 50 y.o. female is here for Hashimoto's Thyroiditis   Subjective: HPI:  Follow-up hyperthyroidism: At her last visit levothyroxine was slightly increased. She tells me for the first 2-3 weeks she noticed a big improvement with the increase in energy. Since then she's felt somewhat fatigued most days out of the week. She denies any difficulty sleeping. She denies any hair or skin complaints. Denies any gastrointestinal complaints. No unintentional weight loss or gain  Follow fibromyalgia: She felt that there was something about Cymbalta that was making her feel well, she has a hard time identifying it. She stopped it and immediately no longer had this odd sensation. She denies any shortness of breath motor or sensory disturbances nor chest pain. She tells me that her fibromyalgia does not seem to be bothering her right now. She denies muscle pain or weakness   Review Of Systems Outlined In HPI  Past Medical History  Diagnosis Date  . Hyperlipidemia   . Thyroid disease   . Allergy     seasonal  . Endometriosis   . Adenomyosis     Past Surgical History  Procedure Laterality Date  . Vaginal hysterectomy      total  . Laparoscopy      x 2  . Bladder suspension    . Btl     Family History  Problem Relation Age of Onset  . Heart attack Mother   . Rheum arthritis Mother   . Heart attack Maternal Grandmother   . Stroke Maternal Grandfather   . Heart attack Brother   . Hypertension Brother   . Diabetes Father   . Cancer Father     prostate  . Diabetes Sister     Social History   Social History  . Marital Status: Married    Spouse Name: N/A  . Number of Children: N/A  . Years of Education: N/A   Occupational History  . Not on file.   Social History Main Topics  . Smoking status: Never Smoker   . Smokeless tobacco: Never Used  . Alcohol Use: Yes     Comment: very rarely  . Drug Use: No  . Sexual Activity:    Partners: Male   Other Topics Concern   . Not on file   Social History Narrative     Objective: BP 135/74 mmHg  Pulse 63  Wt 227 lb (102.967 kg)  Vital signs reviewed. General: Alert and Oriented, No Acute Distress HEENT: Pupils equal, round, reactive to light. Conjunctivae clear.  External ears unremarkable.  Moist mucous membranes. Lungs: Clear and comfortable work of breathing, speaking in full sentences without accessory muscle use. Cardiac: Regular rate and rhythm.  Neuro: CN II-XII grossly intact, gait normal. Extremities: No peripheral edema.  Strong peripheral pulses.  Mental Status: No depression, anxiety, nor agitation. Logical though process. Skin: Warm and dry.  Assessment & Plan: Katrina Clements was seen today for hashimoto's thyroiditis.  Diagnoses and all orders for this visit:  Hypothyroidism due to Hashimoto's thyroiditis -     levothyroxine (SYNTHROID, LEVOTHROID) 125 MCG tablet; Take 1 tablet (125 mcg total) by mouth daily.  Fibromyalgia   Hypothyroidism: TSH is still slightly elevated, uncontrolled chronic condition slightly increasing levothyroxine repeat TSH in 3 months Fibromyalgia: Currently asymptomatic and controlled no need for intervention at this time.  Return in about 3 months (around 08/28/2015) for Recheck thyroid function.Marland Kitchen

## 2015-08-27 ENCOUNTER — Other Ambulatory Visit: Payer: Self-pay

## 2015-08-27 DIAGNOSIS — E785 Hyperlipidemia, unspecified: Secondary | ICD-10-CM

## 2015-08-27 DIAGNOSIS — E039 Hypothyroidism, unspecified: Secondary | ICD-10-CM

## 2015-08-27 LAB — TSH: TSH: 2.33 m[IU]/L

## 2015-08-27 LAB — LIPID PANEL
CHOLESTEROL: 242 mg/dL — AB (ref 125–200)
HDL: 35 mg/dL — ABNORMAL LOW (ref >=46)
LDL Cholesterol: 171 mg/dL — ABNORMAL HIGH (ref <130)
TRIGLYCERIDES: 181 mg/dL — AB (ref <150)
Total CHOL/HDL Ratio: 6.9 Ratio — ABNORMAL HIGH (ref <=5.0)
VLDL: 36 mg/dL — ABNORMAL HIGH (ref <30)

## 2015-08-29 ENCOUNTER — Ambulatory Visit (INDEPENDENT_AMBULATORY_CARE_PROVIDER_SITE_OTHER): Payer: 59 | Admitting: Family Medicine

## 2015-08-29 ENCOUNTER — Encounter: Payer: Self-pay | Admitting: Family Medicine

## 2015-08-29 VITALS — BP 124/75 | HR 77 | Wt 227.0 lb

## 2015-08-29 DIAGNOSIS — E063 Autoimmune thyroiditis: Secondary | ICD-10-CM

## 2015-08-29 DIAGNOSIS — E785 Hyperlipidemia, unspecified: Secondary | ICD-10-CM

## 2015-08-29 DIAGNOSIS — R5383 Other fatigue: Secondary | ICD-10-CM | POA: Diagnosis not present

## 2015-08-29 DIAGNOSIS — E038 Other specified hypothyroidism: Secondary | ICD-10-CM

## 2015-08-29 MED ORDER — VITAMIN D (ERGOCALCIFEROL) 1.25 MG (50000 UNIT) PO CAPS
50000.0000 [IU] | ORAL_CAPSULE | ORAL | Status: DC
Start: 2015-08-29 — End: 2015-11-29

## 2015-08-29 MED ORDER — LEVOTHYROXINE SODIUM 125 MCG PO TABS: 125.0000 ug | ORAL_TABLET | Freq: Every day | ORAL | Status: DC

## 2015-08-29 NOTE — Progress Notes (Signed)
CC: Katrina Clements is a 50 y.o. female is here for Hashimoto's Thyroiditis and Results   Subjective: HPI:  Follow-up hypothyroidism: Taking new 125 MCG levothyroxine with 100% compliance. She tells me that it gave her some energy for the first week that she was taking it but then this seemed to fade. She denies any known intolerance. No gastric intestinal complaints. She denies any hair or skin complaints.  Follow-up hyperlipidemia: She had a moderately elevated LDL level last week however Framingham score is still below 10%. She wants no further soft and she can do to help her triglycerides and HDL. She denies any known personal cardiac disease. She denies any chest pain shortness of breath orthopnea or peripheral edema nor limb claudication.  She's been suffering from fatigue for years that she's always attributed to fibromyalgia. Fluctuates on a weekly basis. She's heard from friends at vitamin D might help with this. She is lactose intolerant so she predicts that her vitamin D level could be low. She's never been on vitamin D supplements in the past. She denies any shortness of breath or weakness   Review Of Systems Outlined In HPI  Past Medical History  Diagnosis Date  . Hyperlipidemia   . Thyroid disease   . Allergy     seasonal  . Endometriosis   . Adenomyosis     Past Surgical History  Procedure Laterality Date  . Vaginal hysterectomy      total  . Laparoscopy      x 2  . Bladder suspension    . Btl     Family History  Problem Relation Age of Onset  . Heart attack Mother   . Rheum arthritis Mother   . Heart attack Maternal Grandmother   . Stroke Maternal Grandfather   . Heart attack Brother   . Hypertension Brother   . Diabetes Father   . Cancer Father     prostate  . Diabetes Sister     Social History   Social History  . Marital Status: Married    Spouse Name: N/A  . Number of Children: N/A  . Years of Education: N/A   Occupational History  . Not on file.    Social History Main Topics  . Smoking status: Never Smoker   . Smokeless tobacco: Never Used  . Alcohol Use: Yes     Comment: very rarely  . Drug Use: No  . Sexual Activity:    Partners: Male   Other Topics Concern  . Not on file   Social History Narrative     Objective: BP 124/75 mmHg  Pulse 77  Wt 227 lb (102.967 kg) Vital signs reviewed. General: Alert and Oriented, No Acute Distress HEENT: Pupils equal, round, reactive to light. Conjunctivae clear.  External ears unremarkable.  Moist mucous membranes. Lungs: Clear and comfortable work of breathing, speaking in full sentences without accessory muscle use. Cardiac: Regular rate and rhythm.  Neuro: CN II-XII grossly intact, gait normal. Extremities: No peripheral edema.  Strong peripheral pulses.  Mental Status: No depression, anxiety, nor agitation. Logical though process. Skin: Warm and dry.  Assessment & Plan: Katrina Clements was seen today for hashimoto's thyroiditis and results.  Diagnoses and all orders for this visit:  Hypothyroidism due to Hashimoto's thyroiditis -     levothyroxine (SYNTHROID, LEVOTHROID) 125 MCG tablet; Take 1 tablet (125 mcg total) by mouth daily.  Hyperlipidemia  Other fatigue -     VITAMIN D 25 Hydroxy (Vit-D Deficiency, Fractures)  Other orders -  Vitamin D, Ergocalciferol, (DRISDOL) 50000 units CAPS capsule; Take 1 capsule (50,000 Units total) by mouth every 7 (seven) days. Recheck Vitamin D in 3 Months   Hypothyroidism: Clinically controlled and TSH was normal last visit therefore continue another 3 months of 125 MCG's of levothyroxine Hyperlipidemia: Discussed diet and exercise interventions that can help with this. Not high enough to need a statin at this time. She is to start fiber supplementation, Metamucil. Fatigue: Seems reasonable to try vitamin D, she would like to do a weekly as opposed to daily supplementation  Follow-up in 3 months for vitamin D level, TSH, lipid panel  25  minutes spent face-to-face during visit today of which at least 50% was counseling or coordinating care regarding: 1. Hypothyroidism due to Hashimoto's thyroiditis   2. Hyperlipidemia   3. Other fatigue      Return in about 3 months (around 11/29/2015).

## 2015-11-25 ENCOUNTER — Other Ambulatory Visit: Payer: Self-pay | Admitting: Family Medicine

## 2015-11-27 ENCOUNTER — Telehealth: Payer: Self-pay

## 2015-11-27 DIAGNOSIS — E063 Autoimmune thyroiditis: Principal | ICD-10-CM

## 2015-11-27 DIAGNOSIS — E038 Other specified hypothyroidism: Secondary | ICD-10-CM

## 2015-11-27 NOTE — Telephone Encounter (Signed)
Lab slip in your in box

## 2015-11-28 NOTE — Telephone Encounter (Signed)
Slip faxed to lab

## 2015-11-29 ENCOUNTER — Encounter: Payer: Self-pay | Admitting: Family Medicine

## 2015-11-29 ENCOUNTER — Ambulatory Visit (INDEPENDENT_AMBULATORY_CARE_PROVIDER_SITE_OTHER): Payer: 59 | Admitting: Family Medicine

## 2015-11-29 VITALS — BP 145/77 | HR 66 | Wt 229.0 lb

## 2015-11-29 DIAGNOSIS — E063 Autoimmune thyroiditis: Secondary | ICD-10-CM

## 2015-11-29 DIAGNOSIS — R300 Dysuria: Secondary | ICD-10-CM

## 2015-11-29 DIAGNOSIS — E038 Other specified hypothyroidism: Secondary | ICD-10-CM

## 2015-11-29 LAB — POCT URINALYSIS DIPSTICK
Bilirubin, UA: NEGATIVE
GLUCOSE UA: NEGATIVE
KETONES UA: NEGATIVE
Nitrite, UA: NEGATIVE
Protein, UA: NEGATIVE
SPEC GRAV UA: 1.015
Urobilinogen, UA: 0.2
pH, UA: 5.5

## 2015-11-29 LAB — VITAMIN D 25 HYDROXY (VIT D DEFICIENCY, FRACTURES): VIT D 25 HYDROXY: 34 ng/mL (ref 30–100)

## 2015-11-29 LAB — TSH: TSH: 1.69 m[IU]/L

## 2015-11-29 MED ORDER — VITAMIN D (ERGOCALCIFEROL) 1.25 MG (50000 UNIT) PO CAPS: 50000.0000 [IU] | ORAL_CAPSULE | ORAL | 0 refills | Status: DC

## 2015-11-29 MED ORDER — NITROFURANTOIN MONOHYD MACRO 100 MG PO CAPS: 100.0000 mg | ORAL_CAPSULE | Freq: Two times a day (BID) | ORAL | 0 refills | Status: DC

## 2015-11-29 MED ORDER — LEVOTHYROXINE SODIUM 125 MCG PO TABS: 125.0000 ug | ORAL_TABLET | Freq: Every day | ORAL | 1 refills | Status: DC

## 2015-11-29 MED ORDER — NITROFURANTOIN MONOHYD MACRO 100 MG PO CAPS: 100.0000 mg | ORAL_CAPSULE | Freq: Two times a day (BID) | ORAL | 0 refills | Status: AC

## 2015-11-29 NOTE — Progress Notes (Signed)
CC: [name] is a 50 y.o. female is here for Results (labs) and Dysuria   Subjective: HPI:  Follow-up hypothyroidism: She tells me her fatigue has improved a little bit with the change of her levothyroxine. She denies any unintentional weight loss or gain. She denies any hair or skin complaints. No gastrointestinal complaints.  She's had some dysuria for the past week. No benefit from cranberry juice. This localized low in the pelvis and only when urinating. She believes there is an odor to her urine. She denies fevers, chills, or flank pain.   Review Of Systems Outlined In HPI  Past Medical History:  Diagnosis Date  . Adenomyosis   . Allergy    seasonal  . Endometriosis   . Hyperlipidemia   . Thyroid disease     Past Surgical History:  Procedure Laterality Date  . BLADDER SUSPENSION    . btl    . LAPAROSCOPY     x 2  . VAGINAL HYSTERECTOMY     total   Family History  Problem Relation Age of Onset  . Heart attack Mother   . Rheum arthritis Mother   . Heart attack Maternal Grandmother   . Stroke Maternal Grandfather   . Heart attack Brother   . Hypertension Brother   . Diabetes Father   . Cancer Father     prostate  . Diabetes Sister     Social History   Social History  . Marital status: Married    Spouse name: N/A  . Number of children: N/A  . Years of education: N/A   Occupational History  . Not on file.   Social History Main Topics  . Smoking status: Never Smoker  . Smokeless tobacco: Never Used  . Alcohol use Yes     Comment: very rarely  . Drug use: No  . Sexual activity: Yes    Partners: Male   Other Topics Concern  . Not on file   Social History Narrative  . No narrative on file     Objective: BP (!) 145/77   Pulse 66   Wt 229 lb (103.9 kg)   BMI 32.86 kg/m   Vital signs reviewed. General: Alert and Oriented, No Acute Distress HEENT: Pupils equal, round, reactive to light. Conjunctivae clear.  External ears unremarkable.  Moist  mucous membranes. Lungs: Clear and comfortable work of breathing, speaking in full sentences without accessory muscle use. Cardiac: Regular rate and rhythm.  Neuro: CN II-XII grossly intact, gait normal. Extremities: No peripheral edema.  Strong peripheral pulses.  Mental Status: No depression, anxiety, nor agitation. Logical though process. Skin: Warm and dry. Assessment & Plan: Katrina Clements was seen today for results and dysuria.  Diagnoses and all orders for this visit:  Dysuria -     POCT urinalysis dipstick -     Urine culture  Hypothyroidism due to Hashimoto's thyroiditis -     Discontinue: levothyroxine (SYNTHROID, LEVOTHROID) 125 MCG tablet; Take 1 tablet (125 mcg total) by mouth daily. -     Discontinue: levothyroxine (SYNTHROID, LEVOTHROID) 125 MCG tablet; Take 1 tablet (125 mcg total) by mouth daily. -     levothyroxine (SYNTHROID, LEVOTHROID) 125 MCG tablet; Take 1 tablet (125 mcg total) by mouth daily. -     nitrofurantoin, macrocrystal-monohydrate, (MACROBID) 100 MG capsule; Take 1 capsule (100 mg total) by mouth 2 (two) times daily. -     Vitamin D, Ergocalciferol, (DRISDOL) 50000 units CAPS capsule; Take 1 capsule (50,000 Units total) by mouth every  7 (seven) days.  Other orders -     Discontinue: nitrofurantoin, macrocrystal-monohydrate, (MACROBID) 100 MG capsule; Take 1 capsule (100 mg total) by mouth 2 (two) times daily. -     Discontinue: Vitamin D, Ergocalciferol, (DRISDOL) 50000 units CAPS capsule; Take 1 capsule (50,000 Units total) by mouth every 7 (seven) days. -     Discontinue: nitrofurantoin, macrocrystal-monohydrate, (MACROBID) 100 MG capsule; Take 1 capsule (100 mg total) by mouth 2 (two) times daily. -     Discontinue: Vitamin D, Ergocalciferol, (DRISDOL) 50000 units CAPS capsule; Take 1 capsule (50,000 Units total) by mouth every 7 (seven) days.   Hypothyroidism: Clinically stable and TSH was normal continue levothyroxine. Urinalysis suspicious for UTI therefore  start Macrobid pending results  Return in about 3 months (around 02/29/2016).

## 2015-12-02 LAB — URINE CULTURE

## 2015-12-20 ENCOUNTER — Telehealth: Payer: Self-pay | Admitting: Physician Assistant

## 2015-12-20 NOTE — Telephone Encounter (Signed)
Pt is a current [facility] pt switching over to [facility] and she has scheduled an appt with her in Nov. And she is wondering if her thyroid and vit d blood work can be ordered before her appt. Thanks

## 2016-02-26 ENCOUNTER — Other Ambulatory Visit: Payer: Self-pay | Admitting: Physician Assistant

## 2016-02-26 DIAGNOSIS — E063 Autoimmune thyroiditis: Principal | ICD-10-CM

## 2016-02-26 DIAGNOSIS — E038 Other specified hypothyroidism: Secondary | ICD-10-CM

## 2016-02-26 DIAGNOSIS — Z1321 Encounter for screening for nutritional disorder: Secondary | ICD-10-CM

## 2016-02-26 LAB — TSH: TSH: 2.6 mIU/L

## 2016-02-27 LAB — VITAMIN D 25 HYDROXY (VIT D DEFICIENCY, FRACTURES): Vit D, 25-Hydroxy: 36 ng/mL (ref 30–100)

## 2016-02-28 ENCOUNTER — Ambulatory Visit (INDEPENDENT_AMBULATORY_CARE_PROVIDER_SITE_OTHER): Payer: 59 | Admitting: Physician Assistant

## 2016-02-28 ENCOUNTER — Encounter: Payer: Self-pay | Admitting: Physician Assistant

## 2016-02-28 VITALS — BP 132/82 | HR 72 | Ht 70.0 in | Wt 228.0 lb

## 2016-02-28 DIAGNOSIS — E063 Autoimmune thyroiditis: Secondary | ICD-10-CM

## 2016-02-28 DIAGNOSIS — E559 Vitamin D deficiency, unspecified: Secondary | ICD-10-CM | POA: Diagnosis not present

## 2016-02-28 DIAGNOSIS — Z23 Encounter for immunization: Secondary | ICD-10-CM | POA: Diagnosis not present

## 2016-02-28 DIAGNOSIS — E785 Hyperlipidemia, unspecified: Secondary | ICD-10-CM

## 2016-02-28 DIAGNOSIS — E038 Other specified hypothyroidism: Secondary | ICD-10-CM

## 2016-02-28 DIAGNOSIS — D279 Benign neoplasm of unspecified ovary: Secondary | ICD-10-CM

## 2016-02-28 DIAGNOSIS — Z131 Encounter for screening for diabetes mellitus: Secondary | ICD-10-CM

## 2016-02-28 MED ORDER — VITAMIN D (ERGOCALCIFEROL) 1.25 MG (50000 UNIT) PO CAPS: 50000.0000 [IU] | ORAL_CAPSULE | ORAL | 3 refills | Status: DC

## 2016-02-28 MED ORDER — LEVOTHYROXINE SODIUM 125 MCG PO TABS: 125.0000 ug | ORAL_TABLET | Freq: Every day | ORAL | 3 refills | Status: DC

## 2016-02-28 NOTE — Patient Instructions (Addendum)
Red yeast rice/fish oil for cholesterol.

## 2016-02-28 NOTE — Progress Notes (Signed)
k

## 2016-03-01 DIAGNOSIS — D279 Benign neoplasm of unspecified ovary: Secondary | ICD-10-CM | POA: Insufficient documentation

## 2016-03-01 NOTE — Progress Notes (Signed)
Subjective:    Patient ID: Katrina Clements, female    DOB: 12-21-1965, 50 y.o.   MRN: [id number]  HPI Pt is a 50 yo female who presents to the clinic to establish care.   .. Active Ambulatory Problems    Diagnosis Date Noted  . Hypothyroidism due to Hashimoto's thyroiditis 12/08/2013  . Hyperlipidemia 12/08/2013  . Fibromyalgia 12/08/2013  . 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 No Resolved Ambulatory Problems   Past Medical History:  Diagnosis Date  . Adenomyosis   . Allergy   . Endometriosis   . Hyperlipidemia   . Thyroid disease    .Marland Kitchen Family History  Problem Relation Age of Onset  . Heart attack Mother   . Rheum arthritis Mother   . Heart attack Maternal Grandmother   . Stroke Maternal Grandfather   . Heart attack Brother   . Hypertension Brother   . Diabetes Father   . Cancer Father     prostate  . Diabetes Sister    .Marland Kitchen Social History   Social History  . Marital status: Married    Spouse name: N/A  . Number of children: N/A  . Years of education: N/A   Occupational History  . Not on file.   Social History Main Topics  . Smoking status: Never Smoker  . Smokeless tobacco: Never Used  . Alcohol use Yes     Comment: very rarely  . Drug use: No  . Sexual activity: Yes    Partners: Male   Other Topics Concern  . Not on file   Social History Narrative  . No narrative on file   Pt needs medication refilled. Over past 6 months her thyroid medication has needed adjustment. She has ongoing fibromyaglia and on and off whole body pain. She has not noticed anything different from normal. She is struggling to lose weight. She is not exercising. She is trying to watch what she eats.    Review of Systems  All other systems reviewed and are negative.      Objective:   Physical Exam  Constitutional: She is oriented to person, place, and time. She appears well-developed and well-nourished.  HENT:  Head:  Normocephalic and atraumatic.  Neck: Normal range of motion. Neck supple. No thyromegaly present.  Cardiovascular: Normal rate, regular rhythm and normal heart sounds.   Pulmonary/Chest: Effort normal and breath sounds normal.  Neurological: She is alert and oriented to person, place, and time.  Psychiatric: She has a normal mood and affect. Her behavior is normal.          Assessment & Plan:  Marland KitchenMarland KitchenDiagnoses and all orders for this visit:  Hypothyroidism due to Hashimoto's thyroiditis -     levothyroxine (SYNTHROID, LEVOTHROID) 125 MCG tablet; Take 1 tablet (125 mcg total) by mouth daily. -     Vitamin D, Ergocalciferol, (DRISDOL) 50000 units CAPS capsule; Take 1 capsule (50,000 Units total) by mouth every 7 (seven) days. -     TSH  Influenza vaccine needed -     Flu Vaccine QUAD 36+ mos PF IM (Fluarix & Fluzone Quad PF)  Hyperlipidemia, unspecified hyperlipidemia type -     Lipid Profile  Vitamin D deficiency -     Vitamin D (25 hydroxy)  Diabetes mellitus screening -     COMPLETE METABOLIC PANEL WITH GFR  Benign neoplasm of ovary, unspecified laterality   Discussed fibromyalgia and treatments. I discussed at this time  the best thing to do is exercise and keep healthy diet. Stay away from all diet sodas. Discussed weight loss. Medications discussed. Pt declined any medications today.  Will call with results to labs.

## 2016-07-02 ENCOUNTER — Telehealth: Payer: Self-pay | Admitting: Physician Assistant

## 2016-07-02 DIAGNOSIS — E785 Hyperlipidemia, unspecified: Secondary | ICD-10-CM

## 2016-07-02 DIAGNOSIS — E038 Other specified hypothyroidism: Secondary | ICD-10-CM

## 2016-07-02 DIAGNOSIS — E559 Vitamin D deficiency, unspecified: Secondary | ICD-10-CM

## 2016-07-02 DIAGNOSIS — Z131 Encounter for screening for diabetes mellitus: Secondary | ICD-10-CM

## 2016-07-02 DIAGNOSIS — E063 Autoimmune thyroiditis: Principal | ICD-10-CM

## 2016-07-02 NOTE — Telephone Encounter (Signed)
Pt stated she needs orders put in for blood work to check her Vitamin D and Thyroid before her appointment on May 10th. Thanks

## 2016-08-19 NOTE — Addendum Note (Signed)
Addended by: Beatris Ship L on: 08/19/2016 08:50 AM   Modules accepted: Orders

## 2016-08-26 LAB — COMPLETE METABOLIC PANEL WITH GFR
ALT: 24 U/L (ref 6–29)
AST: 18 U/L (ref 10–35)
Albumin: 3.8 g/dL (ref 3.6–5.1)
Alkaline Phosphatase: 75 U/L (ref 33–130)
BUN: 11 mg/dL (ref 7–25)
CHLORIDE: 107 mmol/L (ref 98–110)
CO2: 22 mmol/L (ref 20–31)
CREATININE: 0.78 mg/dL (ref 0.50–1.05)
Calcium: 9.1 mg/dL (ref 8.6–10.4)
GFR, Est Non African American: 88 mL/min (ref >=60)
GLUCOSE: 94 mg/dL (ref 65–99)
Potassium: 5.2 mmol/L (ref 3.5–5.3)
SODIUM: 142 mmol/L (ref 135–146)
Total Bilirubin: 0.3 mg/dL (ref 0.2–1.2)
Total Protein: 7.3 g/dL (ref 6.1–8.1)

## 2016-08-26 LAB — LIPID PANEL W/REFLEX DIRECT LDL
Cholesterol: 236 mg/dL — ABNORMAL HIGH (ref <200)
HDL: 33 mg/dL — ABNORMAL LOW (ref >50)
LDL-CHOLESTEROL: 167 mg/dL — AB
NON-HDL CHOLESTEROL (CALC): 203 mg/dL — AB (ref <130)
Total CHOL/HDL Ratio: 7.2 Ratio — ABNORMAL HIGH (ref <5.0)
Triglycerides: 197 mg/dL — ABNORMAL HIGH (ref <150)

## 2016-08-26 LAB — TSH: TSH: 2.39 mIU/L

## 2016-08-27 ENCOUNTER — Ambulatory Visit (INDEPENDENT_AMBULATORY_CARE_PROVIDER_SITE_OTHER): Payer: BLUE CROSS/BLUE SHIELD | Admitting: Physician Assistant

## 2016-08-27 ENCOUNTER — Encounter: Payer: Self-pay | Admitting: Physician Assistant

## 2016-08-27 VITALS — BP 128/81 | HR 78 | Ht 70.0 in | Wt 221.0 lb

## 2016-08-27 DIAGNOSIS — M797 Fibromyalgia: Secondary | ICD-10-CM

## 2016-08-27 DIAGNOSIS — E063 Autoimmune thyroiditis: Secondary | ICD-10-CM

## 2016-08-27 DIAGNOSIS — E785 Hyperlipidemia, unspecified: Secondary | ICD-10-CM | POA: Diagnosis not present

## 2016-08-27 DIAGNOSIS — E038 Other specified hypothyroidism: Secondary | ICD-10-CM | POA: Diagnosis not present

## 2016-08-27 LAB — VITAMIN D 25 HYDROXY (VIT D DEFICIENCY, FRACTURES): Vit D, 25-Hydroxy: 39 ng/mL (ref 30–100)

## 2016-08-27 MED ORDER — LEVOTHYROXINE SODIUM 125 MCG PO TABS: 125.0000 ug | ORAL_TABLET | Freq: Every day | ORAL | 3 refills | Status: DC

## 2016-08-27 MED ORDER — VITAMIN D (ERGOCALCIFEROL) 1.25 MG (50000 UNIT) PO CAPS: 50000.0000 [IU] | ORAL_CAPSULE | ORAL | 3 refills | Status: DC

## 2016-08-27 NOTE — Progress Notes (Signed)
   Subjective:    Patient ID: Katrina Clements, female    DOB: Mar 05, 1966, 51 y.o.   MRN: [id number]  HPI  Pt is a 51 yo female who presents to the clinic for 6 month follow up.   .. Active Ambulatory Problems    Diagnosis Date Noted  . Hypothyroidism due to Hashimoto's thyroiditis 12/08/2013  . Hyperlipidemia 12/08/2013  . Fibromyalgia 12/08/2013  . 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 Dyslipidemia (high LDL; low HDL) 08/27/2016   Resolved Ambulatory Problems    Diagnosis Date Noted  . No Resolved Ambulatory Problems   Past Medical History:  Diagnosis Date  . Adenomyosis   . Allergy   . Endometriosis   . Hyperlipidemia   . Thyroid disease    She admits to having more fibromyalgia flares where she aches all over. She feels like it is linked to her hashimotos. She is reading more on diets and how to control hashimotos. She is not exercises due to time. She has tried lyrica, gabapentin, cymbalta with great benefits but side effects outweighed benefits.    Review of Systems  All other systems reviewed and are negative.      Objective:   Physical Exam  Constitutional: She is oriented to person, place, and time. She appears well-developed and well-nourished.  HENT:  Head: Normocephalic and atraumatic.  Neck: Normal range of motion. Neck supple. No thyromegaly present.  Cardiovascular: Normal rate, regular rhythm and normal heart sounds.   Pulmonary/Chest: Effort normal and breath sounds normal.  Neurological: She is alert and oriented to person, place, and time.  Psychiatric: She has a normal mood and affect. Her behavior is normal.          Assessment & Plan:  Marland KitchenMarland KitchenSofya was seen today for hypothyroidism.  Diagnoses and all orders for this visit:  Hypothyroidism due to Hashimoto's thyroiditis -     Vitamin D, Ergocalciferol, (DRISDOL) 50000 units CAPS capsule; Take 1 capsule (50,000 Units total) by mouth every 7 (seven) days. -     levothyroxine (SYNTHROID, LEVOTHROID) 125  MCG tablet; Take 1 tablet (125 mcg total) by mouth daily.  Fibromyalgia  Dyslipidemia (high LDL; low HDL)   Refilled levothyroxine based on labs looks good.   Strongly consider statin. Pt declines. Discussed red yeast rice and low fat diet and exercise. Follow up in 6 months.   For fibromyalgia pain: discussed tumeric and consider trying savella. She has not tried. Gave HO on side effects. Will call if would like to try.   Follow up in 6 months.

## 2016-08-27 NOTE — Patient Instructions (Addendum)
Red yeast rice 1200mg  twice a day.   Tumeric 500mg  twice a day.   [employer].    Dyslipidemia Dyslipidemia is an imbalance of waxy, fat-like substances (lipids) in the blood. The body needs lipids in small amounts. Dyslipidemia often involves a high level of cholesterol or triglycerides, which are types of lipids. Common forms of dyslipidemia include:  High levels of bad cholesterol (LDL cholesterol). LDL is the type of cholesterol that causes fatty deposits (plaques) to build up in the blood vessels that carry blood away from your heart (arteries).  Low levels of good cholesterol (HDL cholesterol). HDL cholesterol is the type of cholesterol that protects against heart disease. High levels of HDL remove the LDL buildup from arteries.  High levels of triglycerides. Triglycerides are a fatty substance in the blood that is linked to a buildup of plaques in the arteries. You can develop dyslipidemia because of the genes you are born with (primary dyslipidemia) or changes that occur during your life (secondary dyslipidemia), or as a side effect of certain medical treatments. What are the causes? Primary dyslipidemia is caused by changes (mutations) in genes that are passed down through families (inherited). These mutations cause several types of dyslipidemia. Mutations can result in disorders that make the body produce too much LDL cholesterol or triglycerides, or not enough HDL cholesterol. These disorders may lead to heart disease, arterial disease, or stroke at an early age. Causes of secondary dyslipidemia include certain lifestyle choices and diseases that lead to dyslipidemia, such as:  Eating a diet that is high in animal fat.  Not getting enough activity or exercise (having a sedentary lifestyle).  Having diabetes, kidney disease, liver disease, or thyroid disease.  Drinking large amounts of alcohol.  Using certain types of drugs. What increases the  risk? You may be at greater risk for dyslipidemia if you are an older man or if you are a woman who has gone through menopause. Other risk factors include:  Having a family history of dyslipidemia.  Taking certain medicines, including birth control pills, steroids, some diuretics, beta-blockers, and some medicines forHIV.  Smoking cigarettes.  Eating a high-fat diet.  Drinking large amounts of alcohol.  Having certain medical conditions such as diabetes, polycystic ovary syndrome (PCOS), pregnancy, kidney disease, liver disease, or hypothyroidism.  Not exercising regularly.  Being overweight or obese with too much belly fat. What are the signs or symptoms? Dyslipidemia does not usually cause any symptoms. Very high lipid levels can cause fatty bumps under the skin (xanthomas) or a white or gray ring around the black center (pupil) of the eye. Very high triglyceride levels can cause inflammation of the pancreas (pancreatitis). How is this diagnosed? Your health care provider may diagnose dyslipidemia based on a routine blood test (fasting blood test). Because most people do not have symptoms of the condition, this blood testing (lipid profile) is done on adults age 33 and older and is repeated every 5 years. This test checks:  Total cholesterol. This is a measure of the total amount of cholesterol in your blood, including LDL cholesterol, HDL cholesterol, and triglycerides. A healthy number is below 200.  LDL cholesterol. The target number for LDL cholesterol is different for each person, depending on individual risk factors. For most people, a number below 100 is healthy. Ask your health care provider what your LDL cholesterol number should be.  HDL cholesterol. An HDL level of 60 or higher is best because it helps to protect against  heart disease. A number below 62 for men or below 60 for women increases the risk for heart disease.  Triglycerides. A healthy triglyceride number is  below 150. If your lipid profile is abnormal, your health care provider may do other blood tests to get more information about your condition. How is this treated? Treatment depends on the type of dyslipidemia that you have and your other risk factors for heart disease and stroke. Your health care provider will have a target range for your lipid levels based on this information. For many people, treatment starts with lifestyle changes, such as diet and exercise. Your health care provider may recommend that you:  Get regular exercise.  Make changes to your diet.  Quit smoking if you smoke. If diet changes and exercise do not help you reach your goals, your health care provider may also prescribe medicine to lower lipids. The most commonly prescribed type of medicine lowers your LDL cholesterol (statin drug). If you have a high triglyceride level, your provider may prescribe another type of drug (fibrate) or an omega-3 fish oil supplement, or both. Follow these instructions at home:  Take over-the-counter and prescription medicines only as told by your health care provider. This includes supplements.  Get regular exercise. Start an aerobic exercise and strength training program as told by your health care provider. Ask your health care provider what activities are safe for you. Your health care provider may recommend:  30 minutes of aerobic activity 4-6 days a week. Brisk walking is an example of aerobic activity.  Strength training 2 days a week.  Eat a healthy diet as told by your health care provider. This can help you reach and maintain a healthy weight, lower your LDL cholesterol, and raise your HDL cholesterol. It may help to work with a diet and nutrition specialist (dietitian) to make a plan that is right for you. Your dietitian or health care provider may recommend:  Limiting your calories, if you are overweight.  Eating more fruits, vegetables, whole grains, fish, and lean  meats.  Limiting saturated fat, trans fat, and cholesterol.  Follow instructions from your health care provider or dietitian about eating or drinking restrictions.  Limit alcohol intake to no more than one drink per day for nonpregnant women and two drinks per day for men. One drink equals 12 oz of beer, 5 oz of wine, or 1 oz of hard liquor.  Do not use any products that contain nicotine or tobacco, such as cigarettes and e-cigarettes. If you need help quitting, ask your health care provider.  Keep all follow-up visits as told by your health care provider. This is important. Contact a health care provider if:  You are having trouble sticking to your exercise or diet plan.  You are struggling to quit smoking or control your use of alcohol. Summary  Dyslipidemia is an imbalance of waxy, fat-like substances (lipids) in the blood. The body needs lipids in small amounts. Dyslipidemia often involves a high level of cholesterol or triglycerides, which are types of lipids.  Treatment depends on the type of dyslipidemia that you have and your other risk factors for heart disease and stroke.  For many people, treatment starts with lifestyle changes, such as diet and exercise. Your health care provider may also prescribe medicine to lower lipids. This information is not intended to replace advice given to you by your health care provider. Make sure you discuss any questions you have with your health care provider. Document Released: 04/11/2013  Document Revised: 12/02/2015 Document Reviewed: 12/02/2015 Elsevier Interactive Patient Education  2017 [employer].

## 2016-08-28 ENCOUNTER — Ambulatory Visit: Payer: 59 | Admitting: Physician Assistant

## 2016-12-23 ENCOUNTER — Other Ambulatory Visit: Payer: Self-pay | Admitting: Physician Assistant

## 2016-12-23 DIAGNOSIS — E063 Autoimmune thyroiditis: Principal | ICD-10-CM

## 2016-12-23 DIAGNOSIS — E038 Other specified hypothyroidism: Secondary | ICD-10-CM

## 2016-12-24 MED ORDER — VITAMIN D (ERGOCALCIFEROL) 1.25 MG (50000 UNIT) PO CAPS: 50000.0000 [IU] | ORAL_CAPSULE | ORAL | 3 refills | Status: DC

## 2017-02-24 ENCOUNTER — Telehealth: Payer: Self-pay | Admitting: *Deleted

## 2017-02-24 DIAGNOSIS — E559 Vitamin D deficiency, unspecified: Secondary | ICD-10-CM

## 2017-02-24 DIAGNOSIS — E038 Other specified hypothyroidism: Secondary | ICD-10-CM

## 2017-02-24 DIAGNOSIS — E063 Autoimmune thyroiditis: Principal | ICD-10-CM

## 2017-02-24 DIAGNOSIS — E785 Hyperlipidemia, unspecified: Secondary | ICD-10-CM

## 2017-02-24 NOTE — Telephone Encounter (Signed)
Labs ordered for upcoming appt

## 2017-02-26 ENCOUNTER — Ambulatory Visit (INDEPENDENT_AMBULATORY_CARE_PROVIDER_SITE_OTHER): Payer: BLUE CROSS/BLUE SHIELD | Admitting: Physician Assistant

## 2017-02-26 ENCOUNTER — Encounter: Payer: Self-pay | Admitting: Physician Assistant

## 2017-02-26 VITALS — BP 110/80 | HR 98 | Ht 70.0 in | Wt 226.0 lb

## 2017-02-26 DIAGNOSIS — M797 Fibromyalgia: Secondary | ICD-10-CM | POA: Diagnosis not present

## 2017-02-26 DIAGNOSIS — E785 Hyperlipidemia, unspecified: Secondary | ICD-10-CM | POA: Diagnosis not present

## 2017-02-26 DIAGNOSIS — E038 Other specified hypothyroidism: Secondary | ICD-10-CM | POA: Diagnosis not present

## 2017-02-26 DIAGNOSIS — E063 Autoimmune thyroiditis: Secondary | ICD-10-CM

## 2017-02-26 DIAGNOSIS — J0100 Acute maxillary sinusitis, unspecified: Secondary | ICD-10-CM | POA: Diagnosis not present

## 2017-02-26 LAB — LIPID PANEL W/REFLEX DIRECT LDL
CHOL/HDL RATIO: 6.2 (calc) — AB (ref <5.0)
CHOLESTEROL: 243 mg/dL — AB (ref <200)
HDL: 39 mg/dL — ABNORMAL LOW (ref >50)
LDL CHOLESTEROL (CALC): 174 mg/dL — AB
Non-HDL Cholesterol (Calc): 204 mg/dL (calc) — ABNORMAL HIGH (ref <130)
TRIGLYCERIDES: 157 mg/dL — AB (ref <150)

## 2017-02-26 LAB — TSH: TSH: 3 mIU/L

## 2017-02-26 LAB — VITAMIN D 25 HYDROXY (VIT D DEFICIENCY, FRACTURES): Vit D, 25-Hydroxy: 44 ng/mL (ref 30–100)

## 2017-02-26 MED ORDER — LEVOTHYROXINE SODIUM 125 MCG PO TABS: 125.0000 ug | ORAL_TABLET | Freq: Every day | ORAL | 5 refills | Status: DC

## 2017-02-26 MED ORDER — AMOXICILLIN 875 MG PO TABS: 875.0000 mg | ORAL_TABLET | Freq: Two times a day (BID) | ORAL | 0 refills | Status: DC

## 2017-02-26 NOTE — Patient Instructions (Signed)

## 2017-02-26 NOTE — Progress Notes (Signed)
   Subjective:    Patient ID: Lauralynn Clements, female    DOB: 28-Sep-1965, 51 y.o.   MRN: [id number]  HPI Pt is a 51 yo pleasant female who presents to the clinic to discuss blood work.   She has also had sinus pressure, pain, ear popping, congestion, sinus drainage for last 2 weeks. She thought she was getting better with mucinex but hit her again Monday. No fever, chills, ST.   .. Active Ambulatory Problems    Diagnosis Date Noted  . Hypothyroidism due to Hashimoto's thyroiditis 12/08/2013  . Hyperlipidemia 12/08/2013  . Fibromyalgia 12/08/2013  . 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 Dyslipidemia (high LDL; low HDL) 08/27/2016   Resolved Ambulatory Problems    Diagnosis Date Noted  . No Resolved Ambulatory Problems   Past Medical History:  Diagnosis Date  . Adenomyosis   . Allergy   . Endometriosis   . Hyperlipidemia   . Thyroid disease      Review of Systems  All other systems reviewed and are negative.      Objective:   Physical Exam  Constitutional: She is oriented to person, place, and time. She appears well-developed and well-nourished.  HENT:  Head: Normocephalic and atraumatic.  Right Ear: External ear normal.  Left Ear: External ear normal.  Tenderness to palpation over maxillary sinuses.   Neck: Normal range of motion. Neck supple.  Cardiovascular: Normal rate, regular rhythm and normal heart sounds.  Pulmonary/Chest: Effort normal and breath sounds normal.  Lymphadenopathy:    She has cervical adenopathy.  Neurological: She is alert and oriented to person, place, and time.  Psychiatric: She has a normal mood and affect. Her behavior is normal.          Assessment & Plan:  Marland KitchenMarland KitchenMaylin was seen today for follow-up, nasal congestion and headache.  Diagnoses and all orders for this visit:  Hypothyroidism due to Hashimoto's thyroiditis -     levothyroxine (SYNTHROID, LEVOTHROID) 125 MCG tablet; Take 1 tablet (125 mcg total) daily by  mouth.  Fibromyalgia  Dyslipidemia (high LDL; low HDL)  Acute non-recurrent maxillary sinusitis -     amoxicillin (AMOXIL) 875 MG tablet; Take 1 tablet (875 mg total) 2 (two) times daily by mouth. For 10 days.   Discussed current labs. LDL was 174 and she is working on diet and exercise with little results. She does not want to go on a statin. She will wait another 6 months. Risk vs benefits explained.   Treated for sinus infection. HO given.

## 2017-02-28 ENCOUNTER — Encounter: Payer: Self-pay | Admitting: Physician Assistant

## 2017-03-08 ENCOUNTER — Telehealth: Payer: Self-pay | Admitting: *Deleted

## 2017-03-08 NOTE — Telephone Encounter (Signed)
Pt left vm stating that she has 3 days left of her abx and she is now feeling worse.  She states that she now has a bad cough as well. Does she need another appointment with you? Please advise.

## 2017-03-08 NOTE — Telephone Encounter (Signed)
We could add a burst of prednisone if she would like. For cough is she taking delsym and cough drops?

## 2017-04-19 ENCOUNTER — Other Ambulatory Visit: Payer: Self-pay | Admitting: *Deleted

## 2017-04-19 DIAGNOSIS — E038 Other specified hypothyroidism: Secondary | ICD-10-CM

## 2017-04-19 DIAGNOSIS — E063 Autoimmune thyroiditis: Principal | ICD-10-CM

## 2017-04-19 MED ORDER — VITAMIN D (ERGOCALCIFEROL) 1.25 MG (50000 UNIT) PO CAPS: 50000.0000 [IU] | ORAL_CAPSULE | ORAL | 3 refills | Status: DC

## 2017-04-19 MED ORDER — LEVOTHYROXINE SODIUM 125 MCG PO TABS: 125.0000 ug | ORAL_TABLET | Freq: Every day | ORAL | 5 refills | Status: DC

## 2017-08-16 ENCOUNTER — Telehealth: Payer: Self-pay

## 2017-08-16 DIAGNOSIS — E038 Other specified hypothyroidism: Secondary | ICD-10-CM

## 2017-08-16 DIAGNOSIS — E782 Mixed hyperlipidemia: Secondary | ICD-10-CM

## 2017-08-16 DIAGNOSIS — E785 Hyperlipidemia, unspecified: Secondary | ICD-10-CM

## 2017-08-16 DIAGNOSIS — E063 Autoimmune thyroiditis: Principal | ICD-10-CM

## 2017-08-16 NOTE — Telephone Encounter (Signed)
Pt left VM stating that since she has an appt with Jade on Friday, she wants to come in on Thursday to have her thyroid lab drawn.  If okay to order, please advise on which labs you would like.  Thanks!

## 2017-08-17 NOTE — Telephone Encounter (Signed)
Labs ordered. Called pt and advised her to come have labs done prior to appt and make sure that she is fasting.  Pt understood and reports she will have labs done fasting on Thursday morning.  No further needs at this time.

## 2017-08-17 NOTE — Telephone Encounter (Signed)
Please order TSH. I noticed her cholesterol levels were very high when last checked. Lets order lipid panel as well with CMP/GFR. Make sure fasting. Will discuss proper plan at visit.

## 2017-08-19 DIAGNOSIS — E782 Mixed hyperlipidemia: Secondary | ICD-10-CM | POA: Diagnosis not present

## 2017-08-19 DIAGNOSIS — E785 Hyperlipidemia, unspecified: Secondary | ICD-10-CM | POA: Diagnosis not present

## 2017-08-19 DIAGNOSIS — E063 Autoimmune thyroiditis: Secondary | ICD-10-CM | POA: Diagnosis not present

## 2017-08-19 DIAGNOSIS — E038 Other specified hypothyroidism: Secondary | ICD-10-CM | POA: Diagnosis not present

## 2017-08-19 LAB — TSH: TSH: 4.57 mIU/L — ABNORMAL HIGH

## 2017-08-19 LAB — LIPID PANEL
CHOL/HDL RATIO: 6.1 (calc) — AB (ref <5.0)
CHOLESTEROL: 242 mg/dL — AB (ref <200)
HDL: 40 mg/dL — AB (ref >50)
LDL CHOLESTEROL (CALC): 165 mg/dL — AB
Non-HDL Cholesterol (Calc): 202 mg/dL (calc) — ABNORMAL HIGH (ref <130)
Triglycerides: 206 mg/dL — ABNORMAL HIGH (ref <150)

## 2017-08-19 LAB — COMPLETE METABOLIC PANEL WITH GFR
AG Ratio: 1.3 (calc) (ref 1.0–2.5)
ALKALINE PHOSPHATASE (APISO): 78 U/L (ref 33–130)
ALT: 27 U/L (ref 6–29)
AST: 20 U/L (ref 10–35)
Albumin: 4 g/dL (ref 3.6–5.1)
BUN: 12 mg/dL (ref 7–25)
CALCIUM: 9.1 mg/dL (ref 8.6–10.4)
CO2: 25 mmol/L (ref 20–32)
CREATININE: 0.8 mg/dL (ref 0.50–1.05)
Chloride: 104 mmol/L (ref 98–110)
GFR, Est African American: 98 mL/min/{1.73_m2} (ref >=60)
GFR, Est Non African American: 85 mL/min/{1.73_m2} (ref >=60)
GLUCOSE: 94 mg/dL (ref 65–99)
Globulin: 3 g/dL (calc) (ref 1.9–3.7)
Potassium: 5.3 mmol/L (ref 3.5–5.3)
Sodium: 138 mmol/L (ref 135–146)
Total Bilirubin: 0.4 mg/dL (ref 0.2–1.2)
Total Protein: 7 g/dL (ref 6.1–8.1)

## 2017-08-20 ENCOUNTER — Ambulatory Visit: Payer: BLUE CROSS/BLUE SHIELD | Admitting: Physician Assistant

## 2017-08-25 NOTE — Telephone Encounter (Signed)
Call pt: CV 10 year risk decreased from 3.1 percent to 2 percent. Not at level to treat with medication. Moving in the right direction. Recheck in 1 year. We need to increase your thyroid medication just a hair. You have been taking daily in morning first thing? If yes ok to send 173mcg daily #30 with 1 RF labs to recheck thyroid in 6 weeks.

## 2017-08-26 ENCOUNTER — Other Ambulatory Visit: Payer: Self-pay | Admitting: *Deleted

## 2017-08-26 MED ORDER — LEVOTHYROXINE SODIUM 137 MCG PO TABS: 137.0000 ug | ORAL_TABLET | Freq: Every day | ORAL | 1 refills | Status: DC

## 2017-08-27 ENCOUNTER — Ambulatory Visit: Payer: BLUE CROSS/BLUE SHIELD | Admitting: Physician Assistant

## 2017-08-30 ENCOUNTER — Ambulatory Visit: Payer: BLUE CROSS/BLUE SHIELD | Admitting: Physician Assistant

## 2017-09-16 ENCOUNTER — Other Ambulatory Visit: Payer: Self-pay | Admitting: Physician Assistant

## 2017-09-16 DIAGNOSIS — E038 Other specified hypothyroidism: Secondary | ICD-10-CM

## 2017-09-16 DIAGNOSIS — E063 Autoimmune thyroiditis: Principal | ICD-10-CM

## 2017-10-11 ENCOUNTER — Telehealth: Payer: Self-pay

## 2017-10-11 DIAGNOSIS — E559 Vitamin D deficiency, unspecified: Secondary | ICD-10-CM

## 2017-10-11 DIAGNOSIS — E063 Autoimmune thyroiditis: Principal | ICD-10-CM

## 2017-10-11 DIAGNOSIS — E038 Other specified hypothyroidism: Secondary | ICD-10-CM

## 2017-10-11 NOTE — Telephone Encounter (Signed)
Pt has appt Friday with Luvenia Starch, wants to have labs done prior.   Ordering her 6 week TSH recheck and Vit D panel since it was not checked at last OV and she is requesting it

## 2017-10-13 ENCOUNTER — Other Ambulatory Visit: Payer: Self-pay

## 2017-10-13 DIAGNOSIS — E038 Other specified hypothyroidism: Secondary | ICD-10-CM

## 2017-10-13 DIAGNOSIS — E559 Vitamin D deficiency, unspecified: Secondary | ICD-10-CM | POA: Diagnosis not present

## 2017-10-13 DIAGNOSIS — E063 Autoimmune thyroiditis: Secondary | ICD-10-CM | POA: Diagnosis not present

## 2017-10-14 ENCOUNTER — Other Ambulatory Visit: Payer: Self-pay | Admitting: Physician Assistant

## 2017-10-14 LAB — TSH: TSH: 1.49 m[IU]/L

## 2017-10-14 LAB — VITAMIN D 25 HYDROXY (VIT D DEFICIENCY, FRACTURES): Vit D, 25-Hydroxy: 39 ng/mL (ref 30–100)

## 2017-10-14 MED ORDER — LEVOTHYROXINE SODIUM 137 MCG PO TABS: 137.0000 ug | ORAL_TABLET | Freq: Every day | ORAL | 1 refills | Status: DC

## 2017-10-14 NOTE — Progress Notes (Signed)
Thyroid is much better. Stay on vitamin D high dose still just in the low normal range.

## 2017-10-15 ENCOUNTER — Ambulatory Visit (INDEPENDENT_AMBULATORY_CARE_PROVIDER_SITE_OTHER): Payer: BLUE CROSS/BLUE SHIELD | Admitting: Physician Assistant

## 2017-10-15 ENCOUNTER — Encounter: Payer: Self-pay | Admitting: Physician Assistant

## 2017-10-15 VITALS — BP 120/82 | HR 72 | Wt 223.0 lb

## 2017-10-15 DIAGNOSIS — E038 Other specified hypothyroidism: Secondary | ICD-10-CM

## 2017-10-15 DIAGNOSIS — E063 Autoimmune thyroiditis: Secondary | ICD-10-CM | POA: Diagnosis not present

## 2017-10-15 DIAGNOSIS — E559 Vitamin D deficiency, unspecified: Secondary | ICD-10-CM | POA: Diagnosis not present

## 2017-10-15 DIAGNOSIS — E781 Pure hyperglyceridemia: Secondary | ICD-10-CM | POA: Diagnosis not present

## 2017-10-15 MED ORDER — LEVOTHYROXINE SODIUM 137 MCG PO TABS: 137.0000 ug | ORAL_TABLET | Freq: Every day | ORAL | 1 refills | Status: DC

## 2017-10-15 NOTE — Progress Notes (Signed)
   Subjective:    Patient ID: Katrina Clements, female    DOB: [birth year], 52 y.o.   MRN: [id number]  HPI  Pt is a 52 yo female with hypothyroidism, HLD, vitamin D deficiency who presents to the clinic to go over follow up labs for thyroid.   Recently levothyroxine was increased due to elevated tSH. She does feel better than 3 months ago. No other problems to report.   .. Active Ambulatory Problems    Diagnosis Date Noted  . Hypothyroidism due to Hashimoto's thyroiditis 12/08/2013  . Hyperlipidemia 12/08/2013  . Fibromyalgia 12/08/2013  . 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 Dyslipidemia (high LDL; low HDL) 08/27/2016   Resolved Ambulatory Problems    Diagnosis Date Noted  . No Resolved Ambulatory Problems   Past Medical History:  Diagnosis Date  . Adenomyosis   . Allergy   . Endometriosis   . Hyperlipidemia   . Thyroid disease       Review of Systems  All other systems reviewed and are negative.      Objective:   Physical Exam  Constitutional: She is oriented to person, place, and time. She appears well-developed and well-nourished.  HENT:  Head: Normocephalic and atraumatic.  Neck: No thyromegaly present.  Cardiovascular: Normal rate and regular rhythm.  Pulmonary/Chest: Effort normal and breath sounds normal.  Neurological: She is alert and oriented to person, place, and time.  Psychiatric: She has a normal mood and affect. Her behavior is normal.          Assessment & Plan:  Marland KitchenMarland KitchenAunika was seen today for hypothyroidism.  Diagnoses and all orders for this visit:  Hypothyroidism due to Hashimoto's thyroiditis -     levothyroxine (SYNTHROID, LEVOTHROID) 137 MCG tablet; Take 1 tablet (137 mcg total) by mouth daily before breakfast.  Hypertriglyceridemia  Vitamin D deficiency   Overall thyroid levels have improved. Refilled medication for 6 months.   Discussed elevated LDL, TG and low HDL. Pt is to start paleo diet and target lifestyle changes in next 6 months.  She declines any medication.   Refilled 50,000 units vitamin d to continue.

## 2017-10-15 NOTE — Patient Instructions (Addendum)
Fish oil 4000mg  daily.    Food Choices to Lower Your Triglycerides Triglycerides are a type of fat in your blood. High levels of triglycerides can increase the risk of heart disease and stroke. If your triglyceride levels are high, the foods you eat and your eating habits are very important. Choosing the right foods can help lower your triglycerides. What general guidelines do I need to follow?  Lose weight if you are overweight.  Limit or avoid alcohol.  Fill one half of your plate with vegetables and green salads.  Limit fruit to two servings a day. Choose fruit instead of juice.  Make one fourth of your plate whole grains. Look for the word "whole" as the first word in the ingredient list.  Fill one fourth of your plate with lean protein foods.  Enjoy fatty fish (such as salmon, mackerel, sardines, and tuna) three times a week.  Choose healthy fats.  Limit foods high in starch and sugar.  Eat more home-cooked food and less restaurant, buffet, and fast food.  Limit fried foods.  Cook foods using methods other than frying.  Limit saturated fats.  Check ingredient lists to avoid foods with partially hydrogenated oils (trans fats) in them. What foods can I eat? Grains Whole grains, such as whole wheat or whole grain breads, crackers, cereals, and pasta. Unsweetened oatmeal, bulgur, barley, quinoa, or brown rice. Corn or whole wheat flour tortillas. Vegetables Fresh or frozen vegetables (raw, steamed, roasted, or grilled). Green salads. Fruits All fresh, canned (in natural juice), or frozen fruits. Meat and Other Protein Products Ground beef (85% or leaner), grass-fed beef, or beef trimmed of fat. Skinless chicken or [place]. Ground chicken or [place]. Pork trimmed of fat. All fish and seafood. Eggs. Dried beans, peas, or lentils. Unsalted nuts or seeds. Unsalted canned or dry beans. Dairy Low-fat dairy products, such as skim or 1% milk, 2% or reduced-fat cheeses, low-fat  ricotta or cottage cheese, or plain low-fat yogurt. Fats and Oils Tub margarines without trans fats. Light or reduced-fat mayonnaise and salad dressings. Avocado. Safflower, olive, or canola oils. Natural peanut or almond butter. The items listed above may not be a complete list of recommended foods or beverages. Contact your dietitian for more options. What foods are not recommended? Grains White bread. White pasta. White rice. Cornbread. Bagels, pastries, and croissants. Crackers that contain trans fat. Vegetables White potatoes. Corn. Creamed or fried vegetables. Vegetables in a cheese sauce. Fruits Dried fruits. Canned fruit in light or heavy syrup. Fruit juice. Meat and Other Protein Products Fatty cuts of meat. Ribs, chicken wings, bacon, sausage, bologna, salami, chitterlings, fatback, hot dogs, bratwurst, and packaged luncheon meats. Dairy Whole or 2% milk, cream, half-and-half, and cream cheese. Whole-fat or sweetened yogurt. Full-fat cheeses. Nondairy creamers and whipped toppings. Processed cheese, cheese spreads, or cheese curds. Sweets and Desserts Corn syrup, sugars, honey, and molasses. Candy. Jam and jelly. Syrup. Sweetened cereals. Cookies, pies, cakes, donuts, muffins, and ice cream. Fats and Oils Butter, stick margarine, lard, shortening, ghee, or bacon fat. Coconut, palm kernel, or palm oils. Beverages Alcohol. Sweetened drinks (such as sodas, lemonade, and fruit drinks or punches). The items listed above may not be a complete list of foods and beverages to avoid. Contact your dietitian for more information. This information is not intended to replace advice given to you by your health care provider. Make sure you discuss any questions you have with your health care provider. Document Released: 01/23/2004 Document Revised: 09/12/2015 Document Reviewed: 02/08/2013 Elsevier  Interactive Patient Education  2017 [employer].

## 2018-02-24 ENCOUNTER — Other Ambulatory Visit: Payer: Self-pay

## 2018-02-24 DIAGNOSIS — E063 Autoimmune thyroiditis: Principal | ICD-10-CM

## 2018-02-24 DIAGNOSIS — E038 Other specified hypothyroidism: Secondary | ICD-10-CM

## 2018-02-24 MED ORDER — VITAMIN D (ERGOCALCIFEROL) 1.25 MG (50000 UNIT) PO CAPS: 50000.0000 [IU] | ORAL_CAPSULE | ORAL | 3 refills | Status: DC

## 2018-03-26 ENCOUNTER — Other Ambulatory Visit: Payer: Self-pay | Admitting: Physician Assistant

## 2018-03-26 DIAGNOSIS — E063 Autoimmune thyroiditis: Principal | ICD-10-CM

## 2018-03-26 DIAGNOSIS — E038 Other specified hypothyroidism: Secondary | ICD-10-CM

## 2018-04-04 ENCOUNTER — Other Ambulatory Visit: Payer: Self-pay | Admitting: Physician Assistant

## 2018-04-04 DIAGNOSIS — E063 Autoimmune thyroiditis: Secondary | ICD-10-CM | POA: Diagnosis not present

## 2018-04-04 DIAGNOSIS — E559 Vitamin D deficiency, unspecified: Secondary | ICD-10-CM

## 2018-04-04 DIAGNOSIS — E038 Other specified hypothyroidism: Secondary | ICD-10-CM | POA: Diagnosis not present

## 2018-04-04 NOTE — Progress Notes (Signed)
Lab orders for recheck placed per Pt request

## 2018-04-05 LAB — VITAMIN D 25 HYDROXY (VIT D DEFICIENCY, FRACTURES): Vit D, 25-Hydroxy: 31 ng/mL (ref 30–100)

## 2018-04-05 LAB — TSH: TSH: 2.45 mIU/L

## 2018-04-05 NOTE — Progress Notes (Signed)
Call pt: thyroid normal. Vitamin D is very low normal. How much vitamin D are you taking? Ok to send 90 day rx with 3 refills for patient.

## 2018-04-06 ENCOUNTER — Ambulatory Visit (INDEPENDENT_AMBULATORY_CARE_PROVIDER_SITE_OTHER): Payer: BLUE CROSS/BLUE SHIELD

## 2018-04-06 ENCOUNTER — Ambulatory Visit (INDEPENDENT_AMBULATORY_CARE_PROVIDER_SITE_OTHER): Payer: BLUE CROSS/BLUE SHIELD | Admitting: Physician Assistant

## 2018-04-06 ENCOUNTER — Encounter: Payer: Self-pay | Admitting: Physician Assistant

## 2018-04-06 VITALS — BP 125/70 | Ht 70.0 in | Wt 228.0 lb

## 2018-04-06 DIAGNOSIS — M5020 Other cervical disc displacement, unspecified cervical region: Secondary | ICD-10-CM | POA: Insufficient documentation

## 2018-04-06 DIAGNOSIS — J3489 Other specified disorders of nose and nasal sinuses: Secondary | ICD-10-CM

## 2018-04-06 DIAGNOSIS — Z1231 Encounter for screening mammogram for malignant neoplasm of breast: Secondary | ICD-10-CM | POA: Diagnosis not present

## 2018-04-06 DIAGNOSIS — E063 Autoimmune thyroiditis: Secondary | ICD-10-CM

## 2018-04-06 DIAGNOSIS — E038 Other specified hypothyroidism: Secondary | ICD-10-CM | POA: Diagnosis not present

## 2018-04-06 DIAGNOSIS — Z1211 Encounter for screening for malignant neoplasm of colon: Secondary | ICD-10-CM

## 2018-04-06 DIAGNOSIS — E039 Hypothyroidism, unspecified: Secondary | ICD-10-CM

## 2018-04-06 MED ORDER — MELOXICAM 15 MG PO TABS: 15.0000 mg | ORAL_TABLET | Freq: Every day | ORAL | 1 refills | Status: DC

## 2018-04-06 MED ORDER — LEVOTHYROXINE SODIUM 137 MCG PO TABS: 137.0000 ug | ORAL_TABLET | Freq: Every day | ORAL | 1 refills | Status: DC

## 2018-04-06 MED ORDER — FLUTICASONE PROPIONATE 50 MCG/ACT NA SUSP: 2.0000 | Freq: Every day | NASAL | 1 refills | Status: DC

## 2018-04-06 NOTE — Progress Notes (Signed)
Subjective:    Patient ID: Katrina Clements, female    DOB: [birth year], 52 y.o.   MRN: [id number]  HPI  Pt is a 52 yo female with hypothyroidism who presents to the clinic for 6 month follow up.   Hypothyroidism. Labs just checked. Feeling a little more tired than normal.   Hx of cervial herniated disc. At times she has some flares. She is requesting medication to take as needed. For the past few days she has had some left sided neck and upper back tightness. No new injury. No radicular pain.   She has had some sinus pressure since yesterday. No fever, chills, body aches, ST, ear pain. Not tried anything to make better. Concerned about sinus pressure.   .. Active Ambulatory Problems    Diagnosis Date Noted  . Hypothyroidism due to Hashimoto's thyroiditis 12/08/2013  . Hyperlipidemia 12/08/2013  . Fibromyalgia 12/08/2013  . 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 Dyslipidemia (high LDL; low HDL) 08/27/2016  . Cervical herniated disc 04/06/2018   Resolved Ambulatory Problems    Diagnosis Date Noted  . No Resolved Ambulatory Problems   Past Medical History:  Diagnosis Date  . Adenomyosis   . Allergy   . Endometriosis   . Thyroid disease      Review of Systems  All other systems reviewed and are negative.      Objective:   Physical Exam Vitals signs and nursing note reviewed.  Constitutional:      Appearance: Normal appearance.  HENT:     Head: Normocephalic and atraumatic.     Right Ear: Tympanic membrane, ear canal and external ear normal.     Left Ear: Tympanic membrane, ear canal and external ear normal.     Ears:     Comments: tenderess over maxillary sinuses to palpation.     Nose: Congestion present.     Mouth/Throat:     Mouth: Mucous membranes are moist.  Cardiovascular:     Rate and Rhythm: Normal rate and regular rhythm.  Pulmonary:     Effort: Pulmonary effort is normal.     Breath sounds: Normal breath sounds.  Lymphadenopathy:     Cervical: No cervical  adenopathy.  Neurological:     General: No focal deficit present.     Mental Status: She is alert and oriented to person, place, and time.  Psychiatric:        Mood and Affect: Mood normal.        Behavior: Behavior normal.           Assessment & Plan:  Marland KitchenMarland KitchenDarlena was seen today for follow-up.  Diagnoses and all orders for this visit:  Hypothyroidism due to Hashimoto's thyroiditis -     levothyroxine (SYNTHROID, LEVOTHROID) 137 MCG tablet; Take 1 tablet (137 mcg total) by mouth daily before breakfast.  Colon cancer screening -     Cologuard  Visit for screening mammogram -     MM 3D SCREEN BREAST BILATERAL  Cervical herniated disc -     meloxicam (MOBIC) 15 MG tablet; Take 1 tablet (15 mg total) by mouth daily.  Acquired hypothyroidism  Sinus pressure -     fluticasone (FLONASE) 50 MCG/ACT nasal spray; Place 2 sprays into both nostrils daily.   TSH in normal range. Refilled today. Follow up in 6 months.   Added mobic as needed for muscle tightness/pain. Declined muscle relaxer. Discussed exercises to work on. Encouraged heat and ice. Massage therapy could also help.   Reassured no signs  of bacterial infection on exam. Start with flonase. Consider tylenol cold and sinus if symptoms persist. If suddenly worsening or not improving call office consider abx therapy after 1 week.

## 2018-04-07 ENCOUNTER — Encounter: Payer: Self-pay | Admitting: Physician Assistant

## 2018-04-07 NOTE — Progress Notes (Signed)
Call pt: normal mammogram. Follow up in 1 year.

## 2018-04-14 ENCOUNTER — Telehealth: Payer: Self-pay

## 2018-04-14 NOTE — Telephone Encounter (Signed)
Cologuard order and information has been faxed over to cologuard company to get patient set up with cologuard kit.

## 2018-05-09 DIAGNOSIS — Z1211 Encounter for screening for malignant neoplasm of colon: Secondary | ICD-10-CM | POA: Diagnosis not present

## 2018-05-11 LAB — COLOGUARD: Cologuard: NEGATIVE

## 2018-05-12 ENCOUNTER — Ambulatory Visit (INDEPENDENT_AMBULATORY_CARE_PROVIDER_SITE_OTHER): Payer: BLUE CROSS/BLUE SHIELD | Admitting: Physician Assistant

## 2018-05-12 VITALS — Temp 97.9°F

## 2018-05-12 DIAGNOSIS — Z23 Encounter for immunization: Secondary | ICD-10-CM | POA: Diagnosis not present

## 2018-05-12 NOTE — Progress Notes (Signed)
Montine is here for Tdap.   Patient tolerated injection well without complications.  Agree with above plan. Iran Planas PA-C

## 2018-05-19 ENCOUNTER — Encounter: Payer: Self-pay | Admitting: Physician Assistant

## 2018-09-21 ENCOUNTER — Telehealth: Payer: Self-pay | Admitting: Neurology

## 2018-09-21 DIAGNOSIS — Z131 Encounter for screening for diabetes mellitus: Secondary | ICD-10-CM

## 2018-09-21 DIAGNOSIS — E559 Vitamin D deficiency, unspecified: Secondary | ICD-10-CM

## 2018-09-21 DIAGNOSIS — Z13220 Encounter for screening for lipoid disorders: Secondary | ICD-10-CM

## 2018-09-21 NOTE — Telephone Encounter (Signed)
Done

## 2018-09-21 NOTE — Telephone Encounter (Signed)
Patient has visit scheduled in a couple weeks and wants to go ahead and get labs ordered that need completed.   I have pended labs she had a year ago. Please review and advise.

## 2018-09-21 NOTE — Telephone Encounter (Signed)
Patient made aware labs ordered.  

## 2018-09-27 DIAGNOSIS — Z131 Encounter for screening for diabetes mellitus: Secondary | ICD-10-CM | POA: Diagnosis not present

## 2018-09-27 DIAGNOSIS — E559 Vitamin D deficiency, unspecified: Secondary | ICD-10-CM | POA: Diagnosis not present

## 2018-09-27 DIAGNOSIS — Z13220 Encounter for screening for lipoid disorders: Secondary | ICD-10-CM | POA: Diagnosis not present

## 2018-09-28 LAB — LIPID PANEL
Cholesterol: 233 mg/dL — ABNORMAL HIGH (ref <200)
HDL: 37 mg/dL — ABNORMAL LOW (ref >=50)
LDL Cholesterol (Calc): 163 mg/dL (calc) — ABNORMAL HIGH
Non-HDL Cholesterol (Calc): 196 mg/dL (calc) — ABNORMAL HIGH (ref <130)
Total CHOL/HDL Ratio: 6.3 (calc) — ABNORMAL HIGH (ref <5.0)
Triglycerides: 180 mg/dL — ABNORMAL HIGH (ref <150)

## 2018-09-28 LAB — COMPLETE METABOLIC PANEL WITH GFR
AG Ratio: 1.3 (calc) (ref 1.0–2.5)
ALT: 24 U/L (ref 6–29)
AST: 17 U/L (ref 10–35)
Albumin: 3.7 g/dL (ref 3.6–5.1)
Alkaline phosphatase (APISO): 72 U/L (ref 37–153)
BUN: 10 mg/dL (ref 7–25)
CO2: 25 mmol/L (ref 20–32)
Calcium: 8.4 mg/dL — ABNORMAL LOW (ref 8.6–10.4)
Chloride: 107 mmol/L (ref 98–110)
Creat: 0.7 mg/dL (ref 0.50–1.05)
GFR, Est African American: 115 mL/min/{1.73_m2} (ref >=60)
GFR, Est Non African American: 99 mL/min/{1.73_m2} (ref >=60)
Globulin: 2.9 g/dL (calc) (ref 1.9–3.7)
Glucose, Bld: 99 mg/dL (ref 65–99)
Potassium: 4.6 mmol/L (ref 3.5–5.3)
Sodium: 139 mmol/L (ref 135–146)
Total Bilirubin: 0.3 mg/dL (ref 0.2–1.2)
Total Protein: 6.6 g/dL (ref 6.1–8.1)

## 2018-09-28 LAB — VITAMIN D 25 HYDROXY (VIT D DEFICIENCY, FRACTURES): Vit D, 25-Hydroxy: 38 ng/mL (ref 30–100)

## 2018-10-03 NOTE — Telephone Encounter (Signed)
Call pt: LDL elevated. TG a little better. HDL low.  Calcium is a little low.  Will discuss in detail at appt this week.

## 2018-10-06 ENCOUNTER — Ambulatory Visit (INDEPENDENT_AMBULATORY_CARE_PROVIDER_SITE_OTHER): Payer: BC Managed Care – PPO | Admitting: Physician Assistant

## 2018-10-06 VITALS — BP 138/80 | HR 70 | Temp 98.4°F | Ht 70.0 in | Wt 235.0 lb

## 2018-10-06 DIAGNOSIS — E063 Autoimmune thyroiditis: Secondary | ICD-10-CM

## 2018-10-06 DIAGNOSIS — E559 Vitamin D deficiency, unspecified: Secondary | ICD-10-CM

## 2018-10-06 DIAGNOSIS — E039 Hypothyroidism, unspecified: Secondary | ICD-10-CM

## 2018-10-06 DIAGNOSIS — R200 Anesthesia of skin: Secondary | ICD-10-CM

## 2018-10-06 DIAGNOSIS — M5020 Other cervical disc displacement, unspecified cervical region: Secondary | ICD-10-CM

## 2018-10-06 DIAGNOSIS — E785 Hyperlipidemia, unspecified: Secondary | ICD-10-CM

## 2018-10-06 DIAGNOSIS — E038 Other specified hypothyroidism: Secondary | ICD-10-CM

## 2018-10-06 DIAGNOSIS — R202 Paresthesia of skin: Secondary | ICD-10-CM

## 2018-10-06 LAB — T4, FREE: Free T4: 1.3 ng/dL (ref 0.8–1.8)

## 2018-10-06 LAB — TSH: TSH: 3.42 mIU/L

## 2018-10-06 LAB — T3, FREE: T3, Free: 2.8 pg/mL (ref 2.3–4.2)

## 2018-10-06 MED ORDER — LIOTHYRONINE SODIUM 5 MCG PO TABS: 5.0000 ug | ORAL_TABLET | Freq: Every day | ORAL | 1 refills | Status: DC

## 2018-10-06 NOTE — Patient Instructions (Signed)
Will get EmGs Start calcium.

## 2018-10-06 NOTE — Progress Notes (Signed)
Subjective:    Patient ID: Katrina Clements, female    DOB: 03-Jul-1965, 53 y.o.   MRN: [id number]  HPI  Pt is a 53 yo female with hypothyroidism, dyslipidemia, vitamin D def who presents to the clinic for follow up and medication refills.   Her last labs showed CV risk of 3.4 percent in 10 years and low serum calcium. She questions vitamin D and why she can't get it out of th 30's despite what she has taken orally. She does take with fatty foods. She works outside and gets sunlight. She just doesn't feel like she thinks she should on thyroid medication. She questions start cytomel. She states she was on before and felt better.   She does reports some right hand numbness and tingling. Seems to be in hand and all fingers. Denies any current neck pain or radiation into shoulder or down arm. Not tried anything to make better. No injury. Ongoing off and on for months seems to be getting worse. Worse at night and in the mornings.    .. Active Ambulatory Problems    Diagnosis Date Noted  . Hypothyroidism due to Hashimoto's thyroiditis 12/08/2013  . Hyperlipidemia 12/08/2013  . Fibromyalgia 12/08/2013  . 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 Dyslipidemia (high LDL; low HDL) 08/27/2016  . Cervical herniated disc 04/06/2018  . Vitamin D deficiency 10/10/2018  . Acquired hypothyroidism 10/10/2018  . Hypocalcemia 10/10/2018  . Numbness and tingling in right hand 10/10/2018   Resolved Ambulatory Problems    Diagnosis Date Noted  . No Resolved Ambulatory Problems   Past Medical History:  Diagnosis Date  . Adenomyosis   . Allergy   . Endometriosis   . Thyroid disease        Review of Systems See HPI>     Objective:   Physical Exam Vitals signs reviewed.  Constitutional:      Appearance: Normal appearance.  HENT:     Head: Normocephalic.  Neck:     Musculoskeletal: Normal range of motion. No muscular tenderness.  Cardiovascular:     Rate and Rhythm: Normal rate and regular rhythm.   Pulmonary:     Effort: Pulmonary effort is normal.     Breath sounds: Normal breath sounds.  Musculoskeletal:     Comments: NROM of neck.  NROM of right arm.  Positive tinels. Negative phalens.  Strength hand grip 5/5.   Neurological:     General: No focal deficit present.     Mental Status: She is alert and oriented to person, place, and time.  Psychiatric:        Mood and Affect: Mood normal.           Assessment & Plan:  Marland KitchenMarland KitchenDeshon was seen today for hypothyroidism.  Diagnoses and all orders for this visit:  Acquired hypothyroidism -     T3, free -     T4, free -     TSH -     liothyronine (CYTOMEL) 5 MCG tablet; Take 1 tablet (5 mcg total) by mouth daily.  Vitamin D deficiency  Hypothyroidism due to Hashimoto's thyroiditis  Cervical herniated disc -     NCV with EMG(electromyography); Future  Dyslipidemia (high LDL; low HDL)  Hypocalcemia  Numbness and tingling in right hand -     NCV with EMG(electromyography); Future   Will get labs.  Discussed possible to add cytomel.   Due to CV risk ok to work on lifestyle changes.   Add calcium supplement to see if can  get calcium up.   Numbness and tingling could be cervical radiculopathy and/or carpel tunnel. Will get EMGs. Suggested night splint, NSAIds and see if any improvement. Suspect carpel tunnel. Could consider surgery vs hydro dissection procedure with sports medicine.

## 2018-10-07 ENCOUNTER — Ambulatory Visit: Payer: BLUE CROSS/BLUE SHIELD | Admitting: Physician Assistant

## 2018-10-10 ENCOUNTER — Encounter: Payer: Self-pay | Admitting: Physician Assistant

## 2018-10-10 DIAGNOSIS — E559 Vitamin D deficiency, unspecified: Secondary | ICD-10-CM | POA: Insufficient documentation

## 2018-10-10 DIAGNOSIS — E039 Hypothyroidism, unspecified: Secondary | ICD-10-CM | POA: Insufficient documentation

## 2018-10-10 DIAGNOSIS — R200 Anesthesia of skin: Secondary | ICD-10-CM | POA: Insufficient documentation

## 2018-10-10 NOTE — Progress Notes (Signed)
Call pt: TSH on upper limits of normal meaning leaning more towards hypo thyroidism but still in normal range. Your free levels show free t4 much better than t3. t3 on the lower level. I think cytomel is a great addition and recheck labs in 6 weeks.

## 2018-10-11 ENCOUNTER — Telehealth: Payer: Self-pay | Admitting: Neurology

## 2018-10-11 DIAGNOSIS — E039 Hypothyroidism, unspecified: Secondary | ICD-10-CM

## 2018-10-11 MED ORDER — LIOTHYRONINE SODIUM 5 MCG PO TABS: 5.0000 ug | ORAL_TABLET | Freq: Every day | ORAL | 1 refills | Status: DC

## 2018-10-11 NOTE — Telephone Encounter (Signed)
RX sent to [facility] as requested.

## 2018-11-06 ENCOUNTER — Other Ambulatory Visit: Payer: Self-pay | Admitting: Physician Assistant

## 2018-11-06 DIAGNOSIS — E038 Other specified hypothyroidism: Secondary | ICD-10-CM

## 2018-11-28 ENCOUNTER — Encounter: Payer: Self-pay | Admitting: Physician Assistant

## 2018-11-28 ENCOUNTER — Ambulatory Visit (INDEPENDENT_AMBULATORY_CARE_PROVIDER_SITE_OTHER): Payer: BC Managed Care – PPO | Admitting: Neurology

## 2018-11-28 ENCOUNTER — Encounter: Payer: Self-pay | Admitting: Neurology

## 2018-11-28 ENCOUNTER — Other Ambulatory Visit: Payer: Self-pay

## 2018-11-28 DIAGNOSIS — G5603 Carpal tunnel syndrome, bilateral upper limbs: Secondary | ICD-10-CM | POA: Insufficient documentation

## 2018-11-28 DIAGNOSIS — R200 Anesthesia of skin: Secondary | ICD-10-CM

## 2018-11-28 DIAGNOSIS — R202 Paresthesia of skin: Secondary | ICD-10-CM

## 2018-11-28 DIAGNOSIS — M5020 Other cervical disc displacement, unspecified cervical region: Secondary | ICD-10-CM

## 2018-11-28 NOTE — Progress Notes (Signed)
Please refer to EMG and nerve conduction procedure note.  

## 2018-11-28 NOTE — Procedures (Signed)
     HISTORY:  Katrina Clements is a 53 year old patient with a 5-year history of right greater than left hand numbness.  The patient denies any significant neck pain or pain down the arms on either side.  She is dropping things from her hands, she is being evaluated for possible neuropathy or a cervical radiculopathy.  NERVE CONDUCTION STUDIES:  Nerve conduction studies were performed on both upper extremities. The distal motor latencies and motor amplitudes for the median and ulnar nerves were within normal limits. The nerve conduction velocities for these nerves were also normal. The sensory latencies for the ulnar nerves were normal, but there was mild bilateral prolongation for the median sensory latencies bilaterally. The F wave latencies for the ulnar nerves were within normal limits.   EMG STUDIES:  EMG study was performed on the right upper extremity:  The first dorsal interosseous muscle reveals 2 to 4 K units with full recruitment. No fibrillations or positive waves were noted. The abductor pollicis brevis muscle reveals 2 to 4 K units with full recruitment. No fibrillations or positive waves were noted. The extensor indicis proprius muscle reveals 1 to 3 K units with full recruitment. No fibrillations or positive waves were noted. The pronator teres muscle reveals 2 to 3 K units with full recruitment. No fibrillations or positive waves were noted. The biceps muscle reveals 1 to 2 K units with full recruitment. No fibrillations or positive waves were noted. The triceps muscle reveals 2 to 4 K units with full recruitment. No fibrillations or positive waves were noted. The anterior deltoid muscle reveals 2 to 3 K units with full recruitment. No fibrillations or positive waves were noted. The cervical paraspinal muscles were tested at 2 levels. No abnormalities of insertional activity were seen at either level tested. There was good relaxation.  EMG study was performed on the left upper  extremity:  The first dorsal interosseous muscle reveals 2 to 4 K units with full recruitment. No fibrillations or positive waves were noted. The abductor pollicis brevis muscle reveals 2 to 4 K units with full recruitment. No fibrillations or positive waves were noted. The extensor indicis proprius muscle reveals 1 to 3 K units with full recruitment. No fibrillations or positive waves were noted. The pronator teres muscle reveals 2 to 3 K units with full recruitment. No fibrillations or positive waves were noted. The biceps muscle reveals 1 to 2 K units with full recruitment. No fibrillations or positive waves were noted.   IMPRESSION:  Nerve conduction studies done on both upper extremities were unremarkable with exception of prolongation of the median sensory latencies bilaterally consistent with a borderline bilateral carpal tunnel syndrome.  EMG evaluation of the right upper extremity was unremarkable, without evidence of an overlying cervical radiculopathy.  A more limited EMG of the left upper extremity was unremarkable as well.  Jill Alexanders MD 11/28/2018 9:21 AM  [facility] [address] [place], [place] [address]  Phone [phone] Fax [phone]

## 2018-11-28 NOTE — Progress Notes (Addendum)
Nerve studies showed bilateral carpal tunnel syndrome, no radiculopathy. You could either try hydro dissection of the nerve in office or consider referral to have carpal tunnel surgery. Thoughts?         Shelly    Nerve / Sites Muscle Latency Ref. Amplitude Ref. Rel Amp Segments Distance Velocity Ref. Area    ms ms mV mV %  cm m/s m/s mVms  L Median - APB     Wrist APB 3.8 ?4.4 10.4 ?4.0 100 Wrist - APB 7   37.1     Upper arm APB 7.8  8.2  79 Upper arm - Wrist 22 55 ?49 29.8  R Median - APB     Wrist APB 3.9 ?4.4 9.4 ?4.0 100 Wrist - APB 7   29.0     Upper arm APB 8.2  8.6  91.4 Upper arm - Wrist 22 51 ?49 29.0  L Ulnar - ADM     Wrist ADM 2.9 ?3.3 14.2 ?6.0 100 Wrist - ADM 7   42.7     B.Elbow ADM 6.3  13.4  93.8 B.Elbow - Wrist 20 58 ?49 42.9     A.Elbow ADM 8.0  13.3  99.3 A.Elbow - B.Elbow 10 58 ?49 42.3         A.Elbow - Wrist      R Ulnar - ADM     Wrist ADM 2.6 ?3.3 11.1 ?6.0 100 Wrist - ADM 7   36.6     B.Elbow ADM 6.0  10.2  91.9 B.Elbow - Wrist 19 55 ?49 34.2     A.Elbow ADM 7.9  9.9  97.3 A.Elbow - B.Elbow 10 53 ?49 33.2         A.Elbow - Wrist                 SNC    Nerve / Sites Rec. Site Peak Lat Ref.  Amp Ref. Segments Distance    ms ms V V  cm  L Radial - Anatomical snuff box (Forearm)     Forearm Wrist 2.5 ?2.9 25 ?15 Forearm - Wrist 10  R Radial - Anatomical snuff box (Forearm)     Forearm Wrist 2.2 ?2.9 20 ?15 Forearm - Wrist 10  L Median - Orthodromic (Dig II, Mid palm)     Dig II Wrist 3.8 ?3.4 17 ?10 Dig II - Wrist 13  R Median - Orthodromic (Dig II, Mid palm)     Dig II Wrist 4.0 ?3.4 15 ?10 Dig II - Wrist 13  L Ulnar - Orthodromic, (Dig V, Mid palm)     Dig V Wrist 2.6 ?3.1 10 ?5 Dig V - Wrist 11  R Ulnar - Orthodromic, (Dig V, Mid palm)     Dig V Wrist 2.5 ?3.1 10 ?5 Dig V - Wrist 52                 F  Wave    Nerve F Lat Ref.   ms ms  L Ulnar - ADM 25.3 ?32.0  R Ulnar - ADM 28.0 ?32.0

## 2018-12-10 ENCOUNTER — Other Ambulatory Visit: Payer: Self-pay | Admitting: Physician Assistant

## 2018-12-10 DIAGNOSIS — E039 Hypothyroidism, unspecified: Secondary | ICD-10-CM

## 2018-12-28 NOTE — Progress Notes (Signed)
Did patient ever get called with these results?

## 2018-12-29 ENCOUNTER — Telehealth: Payer: Self-pay | Admitting: Neurology

## 2018-12-29 NOTE — Telephone Encounter (Signed)
-----   Message from Donella Stade, [place] sent at 12/28/2018  5:31 PM EDT -----   ----- Message ----- From: Kathrynn Ducking, MD Sent: 11/28/2018   9:24 AM EDT To: Donella Stade, PA-C

## 2018-12-29 NOTE — Telephone Encounter (Signed)
Progress Notes by Donella Stade, PA-C at 11/28/2018 9:00 AM Version 1 of 1 Author: Donella Stade, PA-C Service: - Author Type: Physician Assistant  Filed: 12/28/2018 5:31 PM Encounter Date: 11/28/2018 Status: Signed  Editor: Lavada Mesi (Physician Assistant)    Did patient ever get called with these results?     Progress Notes by Donella Stade, PA-C at 11/28/2018 9:00 AM Version 2 of 2 Author: Donella Stade, PA-C Service: - Author Type: Physician Assistant  Filed: 11/30/2018 7:51 AM Encounter Date: 11/28/2018 Status: Addendum  Editor: Kathrynn Ducking, MD (Physician)  Related Notes: Original Note by Lavada Mesi (Physician Assistant) filed at 11/28/2018 9:32 AM    Nerve studies showed bilateral carpal tunnel syndrome, no radiculopathy. You could either try hydro dissection of the nerve in office or consider referral to have carpal tunnel surgery. Thoughts?     Called patient to confirm she had received results/recommendations. She states Dr. Jannifer Franklin went over results with her and they decided to try wrist splints at night before alternative options. She states this is working and will call us if anything changes and she wants referral elsewhere.

## 2019-01-24 ENCOUNTER — Other Ambulatory Visit: Payer: Self-pay | Admitting: Physician Assistant

## 2019-01-24 DIAGNOSIS — E038 Other specified hypothyroidism: Secondary | ICD-10-CM

## 2019-01-24 DIAGNOSIS — E063 Autoimmune thyroiditis: Secondary | ICD-10-CM

## 2019-03-12 ENCOUNTER — Other Ambulatory Visit: Payer: Self-pay | Admitting: Physician Assistant

## 2019-03-12 DIAGNOSIS — E039 Hypothyroidism, unspecified: Secondary | ICD-10-CM

## 2019-03-21 ENCOUNTER — Telehealth: Payer: Self-pay | Admitting: Neurology

## 2019-03-21 DIAGNOSIS — E559 Vitamin D deficiency, unspecified: Secondary | ICD-10-CM

## 2019-03-21 DIAGNOSIS — E038 Other specified hypothyroidism: Secondary | ICD-10-CM

## 2019-03-21 DIAGNOSIS — E063 Autoimmune thyroiditis: Secondary | ICD-10-CM

## 2019-03-21 MED ORDER — VITAMIN D (ERGOCALCIFEROL) 1.25 MG (50000 UNIT) PO CAPS: 50000.0000 [IU] | ORAL_CAPSULE | ORAL | 0 refills | Status: DC

## 2019-03-21 NOTE — Telephone Encounter (Signed)
Orders placed.   Left message on machine for patient letting her know does not need to be fasting for labs, just thyroid and vit D level being checked.

## 2019-03-21 NOTE — Telephone Encounter (Signed)
Patient left vm stating she needs vitamin D sent to last til appt. RX sent. She wants to have labs drawn before upcoming appt. Please let me know what she needs done.

## 2019-03-21 NOTE — Telephone Encounter (Signed)
TSH, free t4, free t3

## 2019-04-03 DIAGNOSIS — E038 Other specified hypothyroidism: Secondary | ICD-10-CM | POA: Diagnosis not present

## 2019-04-03 DIAGNOSIS — E063 Autoimmune thyroiditis: Secondary | ICD-10-CM | POA: Diagnosis not present

## 2019-04-03 DIAGNOSIS — E559 Vitamin D deficiency, unspecified: Secondary | ICD-10-CM | POA: Diagnosis not present

## 2019-04-04 LAB — T4, FREE: Free T4: 1 ng/dL (ref 0.8–1.8)

## 2019-04-04 LAB — TSH: TSH: 0.78 mIU/L

## 2019-04-04 LAB — VITAMIN D 25 HYDROXY (VIT D DEFICIENCY, FRACTURES): Vit D, 25-Hydroxy: 49 ng/mL (ref 30–100)

## 2019-04-04 LAB — T3, FREE: T3, Free: 3.3 pg/mL (ref 2.3–4.2)

## 2019-04-04 NOTE — Telephone Encounter (Signed)
Katrina Clements,   TSH in normal range as well as free levels. Vitamin d looks better than 6 months ago and normal range.

## 2019-04-07 ENCOUNTER — Ambulatory Visit (INDEPENDENT_AMBULATORY_CARE_PROVIDER_SITE_OTHER): Payer: BC Managed Care – PPO | Admitting: Physician Assistant

## 2019-04-07 ENCOUNTER — Encounter: Payer: Self-pay | Admitting: Physician Assistant

## 2019-04-07 VITALS — Ht 70.0 in | Wt 230.0 lb

## 2019-04-07 DIAGNOSIS — E038 Other specified hypothyroidism: Secondary | ICD-10-CM

## 2019-04-07 DIAGNOSIS — E559 Vitamin D deficiency, unspecified: Secondary | ICD-10-CM | POA: Diagnosis not present

## 2019-04-07 DIAGNOSIS — M5020 Other cervical disc displacement, unspecified cervical region: Secondary | ICD-10-CM | POA: Diagnosis not present

## 2019-04-07 DIAGNOSIS — E063 Autoimmune thyroiditis: Secondary | ICD-10-CM

## 2019-04-07 DIAGNOSIS — J3489 Other specified disorders of nose and nasal sinuses: Secondary | ICD-10-CM

## 2019-04-07 MED ORDER — FLUTICASONE PROPIONATE 50 MCG/ACT NA SUSP: 2.0000 | Freq: Every day | NASAL | 1 refills | Status: DC

## 2019-04-07 MED ORDER — VITAMIN D (ERGOCALCIFEROL) 1.25 MG (50000 UNIT) PO CAPS: 50000.0000 [IU] | ORAL_CAPSULE | ORAL | 3 refills | Status: DC

## 2019-04-07 MED ORDER — MELOXICAM 15 MG PO TABS: 15.0000 mg | ORAL_TABLET | Freq: Every day | ORAL | 1 refills | Status: DC

## 2019-04-07 MED ORDER — LIOTHYRONINE SODIUM 5 MCG PO TABS: ORAL_TABLET | ORAL | 3 refills | Status: DC

## 2019-04-07 NOTE — Progress Notes (Signed)
No issues. Needs refills. Pended to multiple pharmacies. Meloxicam/flonase to [facility]. Cytomel/Vit D to [facility].

## 2019-04-07 NOTE — Progress Notes (Signed)
Patient ID: Katrina Clements, female   DOB: [birth year], 53 y.o.   MRN: [id number] .Marland KitchenVirtual Visit via Video Note  I connected with [name] on 04/07/19 at  9:10 AM EST by a video enabled telemedicine application and verified that I am speaking with the correct person using two identifiers.  Location: Patient:home Provider: home   I discussed the limitations of evaluation and management by telemedicine and the availability of in person appointments. The patient expressed understanding and agreed to proceed.  History of Present Illness: Pt is a 53 yo female with hypothyroidism, vitamin D deficiency, cervical herniated disc who presents to the clinic for medication refills.   Overall patient is doing very well. No problems or concerns. She feels better on cytomel and can tell when she does not take it.   She needs refills on vitamin D and mobic which she does not use every day.   .. Active Ambulatory Problems    Diagnosis Date Noted  . Hypothyroidism due to Hashimoto's thyroiditis 12/08/2013  . Hyperlipidemia 12/08/2013  . Fibromyalgia 12/08/2013  . 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 Dyslipidemia (high LDL; low HDL) 08/27/2016  . Cervical herniated disc 04/06/2018  . Vitamin D deficiency 10/10/2018  . Acquired hypothyroidism 10/10/2018  . Hypocalcemia 10/10/2018  . Numbness and tingling in right hand 10/10/2018  . Bilateral carpal tunnel syndrome 11/28/2018   Resolved Ambulatory Problems    Diagnosis Date Noted  . No Resolved Ambulatory Problems   Past Medical History:  Diagnosis Date  . Adenomyosis   . Allergy   . Endometriosis   . Thyroid disease    Reviewed med, allergy, problem list.     Observations/Objective: No acute distress. Normal appearance and mood.  Normal breathing.   .. Today's Vitals   04/07/19 0851  Weight: 230 lb (104.3 kg)  Height: 5\' 10"  (1.778 m)   Body mass index is 33 kg/m.    Assessment and Plan: Marland KitchenMarland KitchenXaria was seen today for  follow-up.  Diagnoses and all orders for this visit:  Hypothyroidism due to Hashimoto's thyroiditis -     liothyronine (CYTOMEL) 5 MCG tablet; TAKE 1 TABLET(5 MCG) BY MOUTH DAILY  Sinus pressure -     fluticasone (FLONASE) 50 MCG/ACT nasal spray; Place 2 sprays into both nostrils daily.  Cervical herniated disc -     meloxicam (MOBIC) 15 MG tablet; Take 1 tablet (15 mg total) by mouth daily.  Vitamin D deficiency -     Vitamin D, Ergocalciferol, (DRISDOL) 1.25 MG (50000 UT) CAPS capsule; Take 1 capsule (50,000 Units total) by mouth every 7 (seven) days.   TSH just done. In normal range. Continue on synthroid and cytomel. Pt is feeling much etter. Follow up in 6 months.       Follow Up Instructions:    I discussed the assessment and treatment plan with the patient. The patient was provided an opportunity to ask questions and all were answered. The patient agreed with the plan and demonstrated an understanding of the instructions.   The patient was advised to call back or seek an in-person evaluation if the symptoms worsen or if the condition fails to improve as anticipated.    Iran Planas, PA-C

## 2019-10-28 IMAGING — MG DIGITAL SCREENING BILATERAL MAMMOGRAM WITH TOMO AND CAD
8 series · 8 of 24 positions shown · non-contrast
Comparison: Previous exam(s).

CLINICAL DATA: Screening.

EXAM:
DIGITAL SCREENING BILATERAL MAMMOGRAM WITH TOMO AND CAD

[R CC synth-2D]
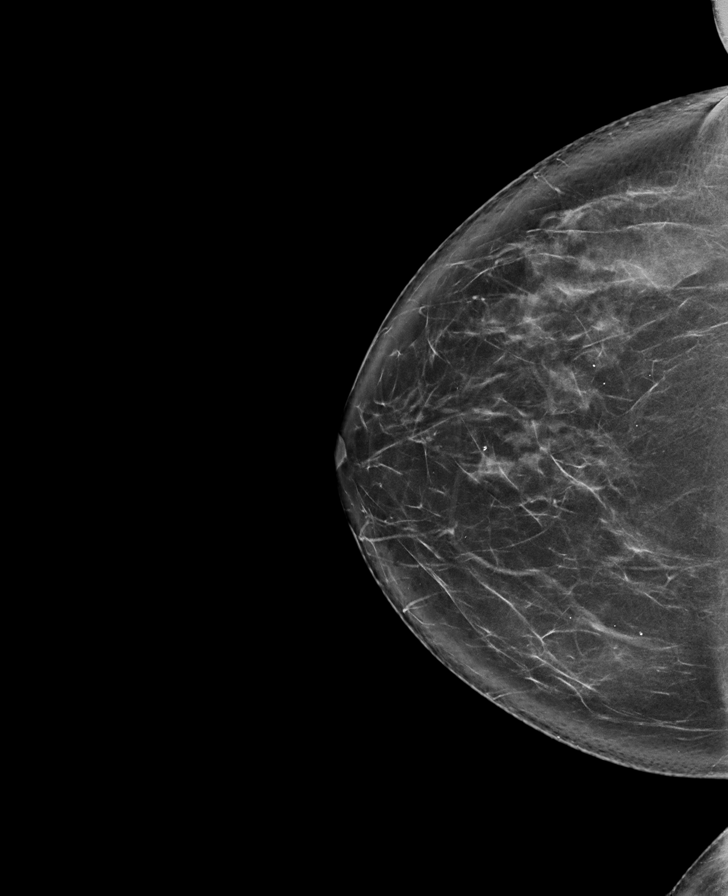

[L MLO synth-2D]
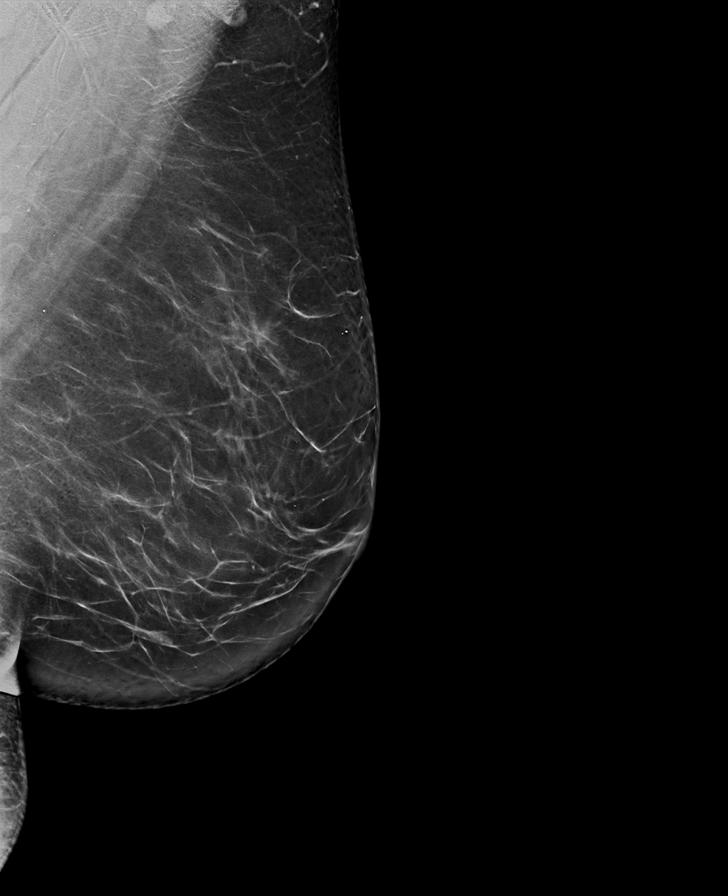

[R MLO synth-2D]
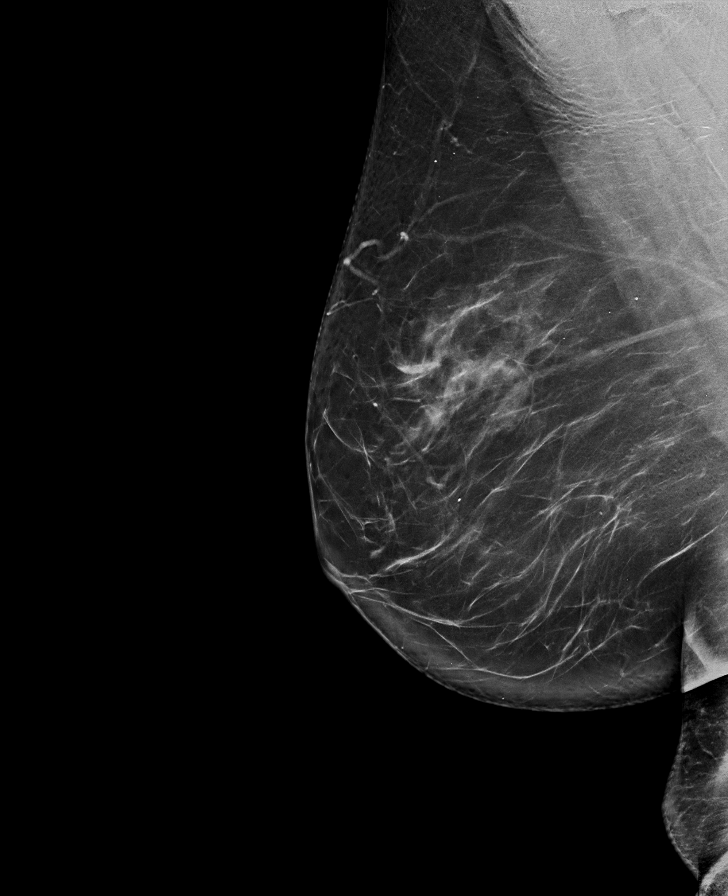

[L CC synth-2D]
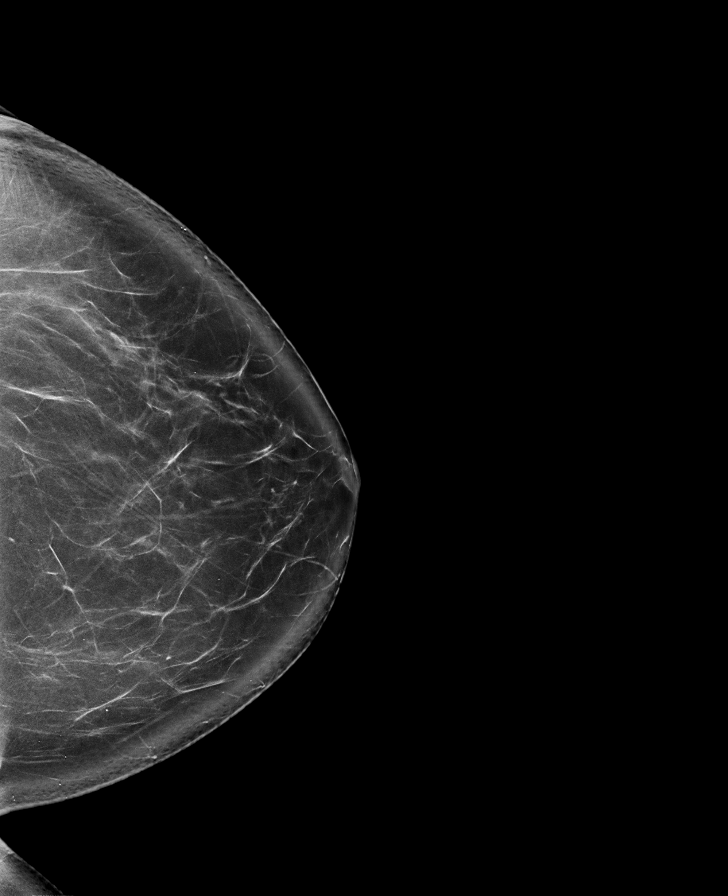

[R CC tomo · tomo slice 44/87.0]
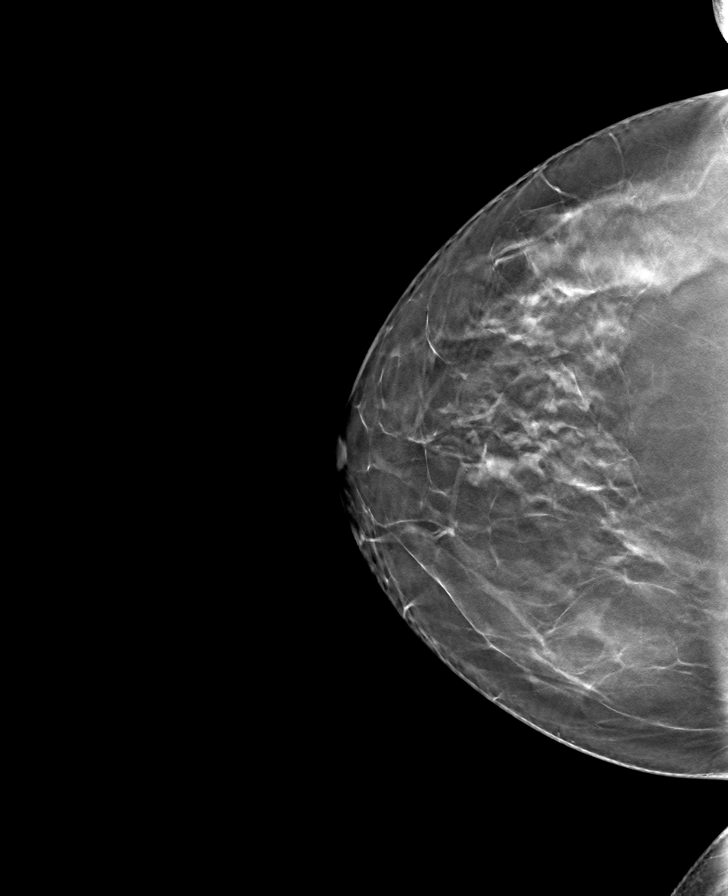

[L MLO tomo · tomo slice 47/92.0]
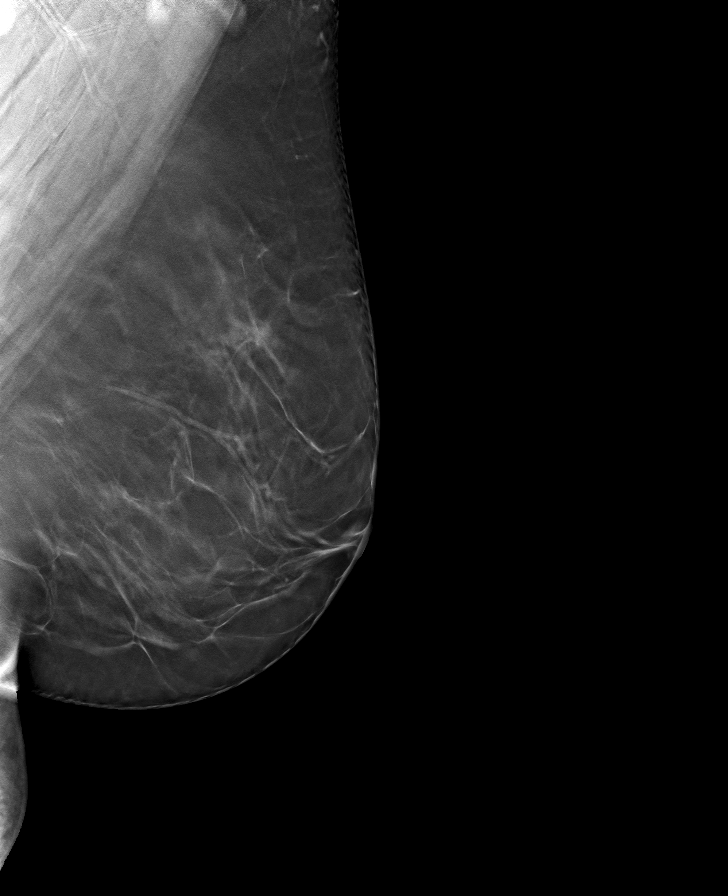

[L CC tomo · tomo slice 47/92.0]
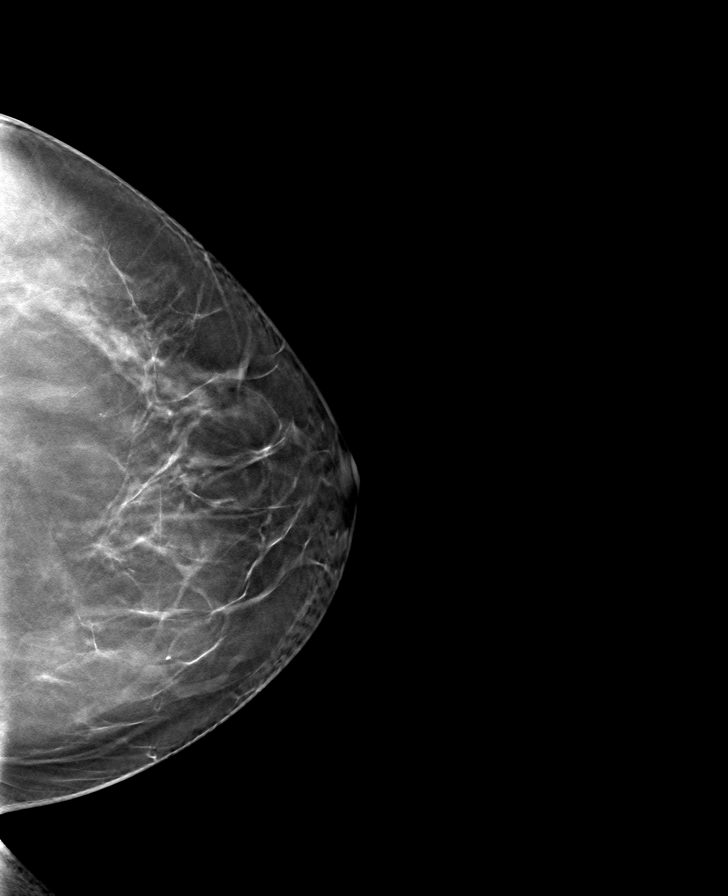

[R MLO tomo · tomo slice 49/96.0]
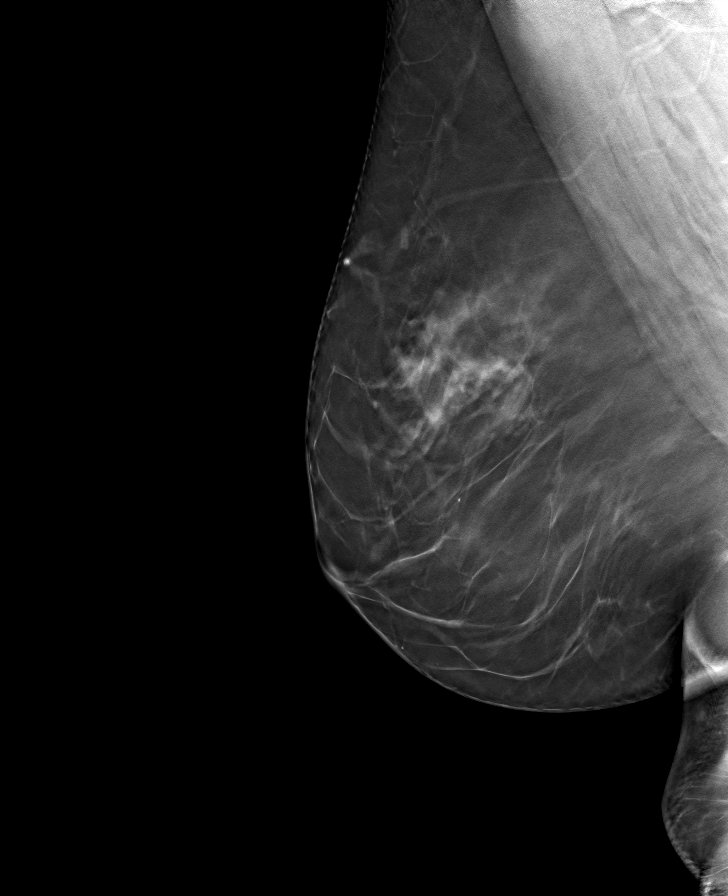

[8 of 24 positions shown; findings below may reference images not displayed]

ACR Breast Density Category b: There are scattered areas of
fibroglandular density.
FINDINGS: There are no findings suspicious for malignancy. Images were
processed with CAD.
IMPRESSION: No mammographic evidence of malignancy. A result letter of this
screening mammogram will be mailed directly to the patient.

RECOMMENDATION:
Screening mammogram in one year. (Code:[id number])

BI-RADS CATEGORY  1: Negative.

## 2019-11-23 ENCOUNTER — Other Ambulatory Visit: Payer: Self-pay | Admitting: Neurology

## 2019-11-23 DIAGNOSIS — Z0000 Encounter for general adult medical examination without abnormal findings: Secondary | ICD-10-CM

## 2019-11-23 DIAGNOSIS — E038 Other specified hypothyroidism: Secondary | ICD-10-CM

## 2019-11-23 DIAGNOSIS — E559 Vitamin D deficiency, unspecified: Secondary | ICD-10-CM

## 2019-11-23 DIAGNOSIS — E785 Hyperlipidemia, unspecified: Secondary | ICD-10-CM

## 2019-11-23 DIAGNOSIS — Z131 Encounter for screening for diabetes mellitus: Secondary | ICD-10-CM

## 2019-11-23 DIAGNOSIS — E063 Autoimmune thyroiditis: Secondary | ICD-10-CM

## 2019-11-23 MED ORDER — LEVOTHYROXINE SODIUM 137 MCG PO TABS: 137.0000 ug | ORAL_TABLET | Freq: Every day | ORAL | 0 refills | Status: DC

## 2019-11-23 NOTE — Telephone Encounter (Signed)
Patient called in for refills of Levothyroxine. Patient states only 3 days left. Wanted 1 month sent to local pharmacy. Patient is due for follow up appt (December appt stated needs seen in 6 months.) Appt made, follow up labs ordered.

## 2019-11-28 NOTE — Progress Notes (Signed)
Patient made aware labs ordered and ready.

## 2019-11-30 DIAGNOSIS — E063 Autoimmune thyroiditis: Secondary | ICD-10-CM | POA: Diagnosis not present

## 2019-11-30 DIAGNOSIS — Z0000 Encounter for general adult medical examination without abnormal findings: Secondary | ICD-10-CM | POA: Diagnosis not present

## 2019-11-30 DIAGNOSIS — E559 Vitamin D deficiency, unspecified: Secondary | ICD-10-CM | POA: Diagnosis not present

## 2019-11-30 DIAGNOSIS — E038 Other specified hypothyroidism: Secondary | ICD-10-CM | POA: Diagnosis not present

## 2019-11-30 DIAGNOSIS — Z131 Encounter for screening for diabetes mellitus: Secondary | ICD-10-CM | POA: Diagnosis not present

## 2019-11-30 LAB — LIPID PANEL W/REFLEX DIRECT LDL
Cholesterol: 236 mg/dL — ABNORMAL HIGH (ref <200)
HDL: 38 mg/dL — ABNORMAL LOW (ref >=50)
LDL Cholesterol (Calc): 168 mg/dL (calc) — ABNORMAL HIGH
Non-HDL Cholesterol (Calc): 198 mg/dL (calc) — ABNORMAL HIGH (ref <130)
Total CHOL/HDL Ratio: 6.2 (calc) — ABNORMAL HIGH (ref <5.0)
Triglycerides: 153 mg/dL — ABNORMAL HIGH (ref <150)

## 2019-11-30 LAB — COMPLETE METABOLIC PANEL WITH GFR
AG Ratio: 1.2 (calc) (ref 1.0–2.5)
ALT: 21 U/L (ref 6–29)
AST: 14 U/L (ref 10–35)
Albumin: 4.1 g/dL (ref 3.6–5.1)
Alkaline phosphatase (APISO): 90 U/L (ref 37–153)
BUN: 15 mg/dL (ref 7–25)
CO2: 27 mmol/L (ref 20–32)
Calcium: 9.3 mg/dL (ref 8.6–10.4)
Chloride: 104 mmol/L (ref 98–110)
Creat: 0.83 mg/dL (ref 0.50–1.05)
GFR, Est African American: 93 mL/min/{1.73_m2} (ref >=60)
GFR, Est Non African American: 80 mL/min/{1.73_m2} (ref >=60)
Globulin: 3.3 g/dL (calc) (ref 1.9–3.7)
Glucose, Bld: 89 mg/dL (ref 65–99)
Potassium: 4.7 mmol/L (ref 3.5–5.3)
Sodium: 139 mmol/L (ref 135–146)
Total Bilirubin: 0.4 mg/dL (ref 0.2–1.2)
Total Protein: 7.4 g/dL (ref 6.1–8.1)

## 2019-11-30 LAB — TSH: TSH: 0.65 mIU/L

## 2019-11-30 LAB — T3, FREE: T3, Free: 3.8 pg/mL (ref 2.3–4.2)

## 2019-11-30 LAB — VITAMIN D 25 HYDROXY (VIT D DEFICIENCY, FRACTURES): Vit D, 25-Hydroxy: 58 ng/mL (ref 30–100)

## 2019-11-30 LAB — T4, FREE: Free T4: 1.2 ng/dL (ref 0.8–1.8)

## 2019-12-04 NOTE — Progress Notes (Signed)
Call pt:  TSH normal range.  Kidney/liver/glucose look good.  Vitamin D great.  CV 10 year risk increased to 3.8 but still under 7.5. I would consider starting a low dose statin to keep risk low. Thoughts?

## 2019-12-06 ENCOUNTER — Encounter: Payer: Self-pay | Admitting: Physician Assistant

## 2019-12-06 ENCOUNTER — Ambulatory Visit (INDEPENDENT_AMBULATORY_CARE_PROVIDER_SITE_OTHER): Payer: BC Managed Care – PPO | Admitting: Physician Assistant

## 2019-12-06 VITALS — BP 126/80 | HR 66 | Temp 98.3°F | Ht 70.0 in | Wt 239.0 lb

## 2019-12-06 DIAGNOSIS — Z6834 Body mass index (BMI) 34.0-34.9, adult: Secondary | ICD-10-CM

## 2019-12-06 DIAGNOSIS — M797 Fibromyalgia: Secondary | ICD-10-CM | POA: Diagnosis not present

## 2019-12-06 DIAGNOSIS — E063 Autoimmune thyroiditis: Secondary | ICD-10-CM

## 2019-12-06 DIAGNOSIS — E785 Hyperlipidemia, unspecified: Secondary | ICD-10-CM

## 2019-12-06 DIAGNOSIS — E559 Vitamin D deficiency, unspecified: Secondary | ICD-10-CM

## 2019-12-06 DIAGNOSIS — M2550 Pain in unspecified joint: Secondary | ICD-10-CM

## 2019-12-06 DIAGNOSIS — E038 Other specified hypothyroidism: Secondary | ICD-10-CM | POA: Diagnosis not present

## 2019-12-06 DIAGNOSIS — E6609 Other obesity due to excess calories: Secondary | ICD-10-CM

## 2019-12-06 MED ORDER — LEVOTHYROXINE SODIUM 137 MCG PO TABS: 137.0000 ug | ORAL_TABLET | Freq: Every day | ORAL | 1 refills | Status: DC

## 2019-12-06 MED ORDER — DULOXETINE HCL 30 MG PO CPEP: 30.0000 mg | ORAL_CAPSULE | Freq: Every day | ORAL | 0 refills | Status: DC

## 2019-12-06 NOTE — Patient Instructions (Addendum)
cymbalta for fibromyalgia.  celebrex for fibromyalgia.  wegovy for weight loss.  Weight Watchers/Intermittent Fasting/walking.

## 2019-12-06 NOTE — Progress Notes (Signed)
Subjective:    Patient ID: Katrina Clements, female    DOB: [birth year], 54 y.o.   MRN: [id number]  HPI  Pt is a 54 yo obese female with hypothyroidism, fibromyalgia who presents to the clinic for 6 month follow up.   She had labs done. TSH great. No problems.   She continues to struggle with weight loss, muscle and joint pain. She has pain every day. She works and pushes through it but aches. Her hands and joints feel stiff in am. She has been worked up for RA and lupus years ago and negative. Her mother has RA. She tried lyrica in past and did great but tons of reflux. It was terrible. She interested in other options.   .. Active Ambulatory Problems    Diagnosis Date Noted  . Hypothyroidism due to Hashimoto's thyroiditis 12/08/2013  . Hyperlipidemia 12/08/2013  . Fibromyalgia 12/08/2013  . 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 Dyslipidemia (high LDL; low HDL) 08/27/2016  . Cervical herniated disc 04/06/2018  . Vitamin D deficiency 10/10/2018  . Hypocalcemia 10/10/2018  . Numbness and tingling in right hand 10/10/2018  . Bilateral carpal tunnel syndrome 11/28/2018  . Arthralgia 12/08/2019  . Class 1 obesity due to excess calories without serious comorbidity with body mass index (BMI) of 34.0 to 34.9 in adult 12/08/2019   Resolved Ambulatory Problems    Diagnosis Date Noted  . Acquired hypothyroidism 10/10/2018   Past Medical History:  Diagnosis Date  . Adenomyosis   . Allergy   . Endometriosis   . Thyroid disease       Review of Systems  All other systems reviewed and are negative.      Objective:   Physical Exam Vitals reviewed.  Constitutional:      Appearance: Normal appearance. She is obese.  HENT:     Head: Normocephalic.  Neck:     Vascular: No carotid bruit.  Cardiovascular:     Rate and Rhythm: Normal rate and regular rhythm.     Pulses: Normal pulses.  Pulmonary:     Effort: Pulmonary effort is normal.     Breath sounds: Normal breath sounds.   Musculoskeletal:     Cervical back: No tenderness.  Lymphadenopathy:     Cervical: No cervical adenopathy.  Neurological:     General: No focal deficit present.     Mental Status: She is alert and oriented to person, place, and time.  Psychiatric:        Mood and Affect: Mood normal.           Assessment & Plan:  Marland KitchenMarland KitchenAlessandra was seen today for hypothyroidism and fibromyalgia.  Diagnoses and all orders for this visit:  Hypothyroidism due to Hashimoto's thyroiditis -     levothyroxine (SYNTHROID) 137 MCG tablet; Take 1 tablet (137 mcg total) by mouth daily before breakfast.  Dyslipidemia (high LDL; low HDL)  Vitamin D deficiency  Fibromyalgia -     DULoxetine (CYMBALTA) 30 MG capsule; Take 1 capsule (30 mg total) by mouth daily.  Arthralgia, unspecified joint  Class 1 obesity due to excess calories without serious comorbidity with body mass index (BMI) of 34.0 to 34.9 in adult   Fibromyalgia impact score was high.  Discussed options. Did not tolerate gabapentin/lyrica.  Will try cymbalta. Discussed side effects.  Consider celebrex in future.  Discussed anti-inflammatory diet.  Discussed IF 16:8.  Exercise daily.  Stay on vitamin D looks great.  TSH great. Sent refills.   We may want to repeat  labs for RF due to family history and worsening joint pain.   Marland Kitchen.Discussed low carb diet with 1500 calories and 80g of protein.  Exercising at least 150 minutes a week.  [employer] could be a Microbiologist.  Consider wegovy. Information given.    Per patient had cologuard done but no results in system. Will look for them.

## 2019-12-08 ENCOUNTER — Telehealth: Payer: Self-pay | Admitting: Physician Assistant

## 2019-12-08 ENCOUNTER — Encounter: Payer: Self-pay | Admitting: Physician Assistant

## 2019-12-08 DIAGNOSIS — M2550 Pain in unspecified joint: Secondary | ICD-10-CM | POA: Insufficient documentation

## 2019-12-08 DIAGNOSIS — E6609 Other obesity due to excess calories: Secondary | ICD-10-CM | POA: Insufficient documentation

## 2019-12-08 DIAGNOSIS — Z6834 Body mass index (BMI) 34.0-34.9, adult: Secondary | ICD-10-CM | POA: Insufficient documentation

## 2019-12-08 DIAGNOSIS — E66812 Obesity, class 2: Secondary | ICD-10-CM | POA: Insufficient documentation

## 2019-12-08 NOTE — Telephone Encounter (Signed)
Per patient had cologuard done but no results in system.

## 2019-12-08 NOTE — Telephone Encounter (Signed)
Abstracted on 05/11/2018.

## 2019-12-22 ENCOUNTER — Other Ambulatory Visit: Payer: Self-pay | Admitting: Neurology

## 2019-12-22 DIAGNOSIS — E038 Other specified hypothyroidism: Secondary | ICD-10-CM

## 2019-12-22 MED ORDER — LEVOTHYROXINE SODIUM 137 MCG PO TABS: 137.0000 ug | ORAL_TABLET | Freq: Every day | ORAL | 1 refills | Status: DC

## 2019-12-22 NOTE — Telephone Encounter (Signed)
Patient needs RX for levothyroxine sent to [facility] instead of [facility]. RX sent.

## 2020-02-19 ENCOUNTER — Other Ambulatory Visit: Payer: Self-pay | Admitting: Physician Assistant

## 2020-02-19 DIAGNOSIS — E559 Vitamin D deficiency, unspecified: Secondary | ICD-10-CM

## 2020-04-01 ENCOUNTER — Other Ambulatory Visit: Payer: Self-pay | Admitting: Physician Assistant

## 2020-04-01 DIAGNOSIS — E063 Autoimmune thyroiditis: Secondary | ICD-10-CM

## 2020-04-29 ENCOUNTER — Telehealth: Payer: Self-pay

## 2020-04-29 DIAGNOSIS — E782 Mixed hyperlipidemia: Secondary | ICD-10-CM

## 2020-04-29 DIAGNOSIS — E038 Other specified hypothyroidism: Secondary | ICD-10-CM

## 2020-04-29 DIAGNOSIS — E063 Autoimmune thyroiditis: Secondary | ICD-10-CM

## 2020-04-29 DIAGNOSIS — Z79899 Other long term (current) drug therapy: Secondary | ICD-10-CM

## 2020-04-29 NOTE — Telephone Encounter (Signed)
Pt. Has 6 months f/u appt on 17th and would like for you to place the orders in before her visit.

## 2020-04-30 NOTE — Telephone Encounter (Signed)
I order thyroid and complete metabolic panel. I did not order vitamin D or lipid since we can just check those 1 time year.

## 2020-05-01 DIAGNOSIS — E063 Autoimmune thyroiditis: Secondary | ICD-10-CM | POA: Diagnosis not present

## 2020-05-01 DIAGNOSIS — Z79899 Other long term (current) drug therapy: Secondary | ICD-10-CM | POA: Diagnosis not present

## 2020-05-01 DIAGNOSIS — E038 Other specified hypothyroidism: Secondary | ICD-10-CM | POA: Diagnosis not present

## 2020-05-02 LAB — COMPLETE METABOLIC PANEL WITH GFR
AG Ratio: 1.3 (calc) (ref 1.0–2.5)
ALT: 25 U/L (ref 6–29)
AST: 17 U/L (ref 10–35)
Albumin: 4 g/dL (ref 3.6–5.1)
Alkaline phosphatase (APISO): 84 U/L (ref 37–153)
BUN: 13 mg/dL (ref 7–25)
CO2: 29 mmol/L (ref 20–32)
Calcium: 9.1 mg/dL (ref 8.6–10.4)
Chloride: 104 mmol/L (ref 98–110)
Creat: 0.69 mg/dL (ref 0.50–1.05)
GFR, Est African American: 114 mL/min/{1.73_m2} (ref >=60)
GFR, Est Non African American: 98 mL/min/{1.73_m2} (ref >=60)
Globulin: 3.1 g/dL (calc) (ref 1.9–3.7)
Glucose, Bld: 66 mg/dL (ref 65–139)
Potassium: 5.2 mmol/L (ref 3.5–5.3)
Sodium: 139 mmol/L (ref 135–146)
Total Bilirubin: 0.3 mg/dL (ref 0.2–1.2)
Total Protein: 7.1 g/dL (ref 6.1–8.1)

## 2020-05-02 LAB — SPECIMEN COMPROMISED

## 2020-05-02 LAB — TSH+FREE T4: TSH W/REFLEX TO FT4: 0.92 mIU/L

## 2020-05-02 LAB — T3: T3, Total: 129 ng/dL (ref 76–181)

## 2020-05-02 NOTE — Telephone Encounter (Signed)
Normal range. Meds stay the same.

## 2020-05-03 NOTE — Telephone Encounter (Signed)
Pt is advised.

## 2020-05-06 ENCOUNTER — Ambulatory Visit: Payer: BC Managed Care – PPO | Admitting: Physician Assistant

## 2020-05-13 ENCOUNTER — Other Ambulatory Visit: Payer: Self-pay | Admitting: Physician Assistant

## 2020-05-13 DIAGNOSIS — E559 Vitamin D deficiency, unspecified: Secondary | ICD-10-CM

## 2020-06-05 ENCOUNTER — Other Ambulatory Visit: Payer: Self-pay

## 2020-06-05 ENCOUNTER — Encounter: Payer: Self-pay | Admitting: Physician Assistant

## 2020-06-05 ENCOUNTER — Ambulatory Visit (INDEPENDENT_AMBULATORY_CARE_PROVIDER_SITE_OTHER): Payer: BC Managed Care – PPO | Admitting: Physician Assistant

## 2020-06-05 VITALS — BP 142/82 | HR 74 | Ht 70.0 in | Wt 239.0 lb

## 2020-06-05 DIAGNOSIS — E559 Vitamin D deficiency, unspecified: Secondary | ICD-10-CM

## 2020-06-05 DIAGNOSIS — Z1231 Encounter for screening mammogram for malignant neoplasm of breast: Secondary | ICD-10-CM | POA: Diagnosis not present

## 2020-06-05 DIAGNOSIS — M797 Fibromyalgia: Secondary | ICD-10-CM

## 2020-06-05 DIAGNOSIS — E785 Hyperlipidemia, unspecified: Secondary | ICD-10-CM

## 2020-06-05 DIAGNOSIS — Z8249 Family history of ischemic heart disease and other diseases of the circulatory system: Secondary | ICD-10-CM

## 2020-06-05 DIAGNOSIS — M5020 Other cervical disc displacement, unspecified cervical region: Secondary | ICD-10-CM | POA: Diagnosis not present

## 2020-06-05 DIAGNOSIS — R079 Chest pain, unspecified: Secondary | ICD-10-CM | POA: Insufficient documentation

## 2020-06-05 DIAGNOSIS — E063 Autoimmune thyroiditis: Secondary | ICD-10-CM

## 2020-06-05 DIAGNOSIS — E038 Other specified hypothyroidism: Secondary | ICD-10-CM | POA: Diagnosis not present

## 2020-06-05 MED ORDER — LEVOTHYROXINE SODIUM 137 MCG PO TABS: 137.0000 ug | ORAL_TABLET | Freq: Every day | ORAL | 1 refills | Status: DC

## 2020-06-05 MED ORDER — VITAMIN D (ERGOCALCIFEROL) 1.25 MG (50000 UNIT) PO CAPS: ORAL_CAPSULE | ORAL | 3 refills | Status: DC

## 2020-06-05 MED ORDER — DESVENLAFAXINE SUCCINATE ER 50 MG PO TB24: 50.0000 mg | ORAL_TABLET | Freq: Every day | ORAL | 2 refills | Status: DC

## 2020-06-05 MED ORDER — MELOXICAM 15 MG PO TABS: 15.0000 mg | ORAL_TABLET | Freq: Every day | ORAL | 0 refills | Status: DC | PRN

## 2020-06-05 NOTE — Patient Instructions (Signed)
Myofascial Pain Syndrome and Fibromyalgia Myofascial pain syndrome and fibromyalgia are both pain disorders. This pain may be felt mainly in your muscles.  Myofascial pain syndrome: ? Always has tender points in the muscle that will cause pain when pressed (trigger points). The pain may come and go. ? Usually affects your neck, upper back, and shoulder areas. The pain often radiates into your arms and hands.  Fibromyalgia: ? Has muscle pains and tenderness that come and go. ? Is often associated with fatigue and sleep problems. ? Has trigger points. ? Tends to be long-lasting (chronic), but is not life-threatening. Fibromyalgia and myofascial pain syndrome are not the same. However, they often occur together. If you have both conditions, each can make the other worse. Both are common and can cause enough pain and fatigue to make day-to-day activities difficult. Both can be hard to diagnose because their symptoms are common in many other conditions. What are the causes? The exact causes of these conditions are not known. What increases the risk? You are more likely to develop this condition if:  You have a family history of the condition.  You have certain triggers, such as: ? Spine disorders. ? An injury (trauma) or other physical stressors. ? Being under a lot of stress. ? Medical conditions such as osteoarthritis, rheumatoid arthritis, or lupus. What are the signs or symptoms? Fibromyalgia The main symptom of fibromyalgia is widespread pain and tenderness in your muscles. Pain is sometimes described as stabbing, shooting, or burning. You may also have:  Tingling or numbness.  Sleep problems and fatigue.  Problems with attention and concentration (fibro fog). Other symptoms may include:  Bowel and bladder problems.  Headaches.  Visual problems.  Problems with odors and noises.  Depression or mood changes.  Painful menstrual periods (dysmenorrhea).  Dry skin or  eyes. These symptoms can vary over time. Myofascial pain syndrome Symptoms of myofascial pain syndrome include:  Tight, ropy bands of muscle.  Uncomfortable sensations in muscle areas. These may include aching, cramping, burning, numbness, tingling, and weakness.  Difficulty moving certain parts of the body freely (poor range of motion). How is this diagnosed? This condition may be diagnosed by your symptoms and medical history. You will also have a physical exam. In general:  Fibromyalgia is diagnosed if you have pain, fatigue, and other symptoms for more than 3 months, and symptoms cannot be explained by another condition.  Myofascial pain syndrome is diagnosed if you have trigger points in your muscles, and those trigger points are tender and cause pain elsewhere in your body (referred pain). How is this treated? Treatment for these conditions depends on the type that you have.  For fibromyalgia: ? Pain medicines, such as NSAIDs. ? Medicines for treating depression. ? Medicines for treating seizures. ? Medicines that relax the muscles.  For myofascial pain: ? Pain medicines, such as NSAIDs. ? Cooling and stretching of muscles. ? Trigger point injections. ? Sound wave (ultrasound) treatments to stimulate muscles. Treating these conditions often requires a team of health care providers. These may include:  Your primary care provider.  Physical therapist.  Complementary health care providers, such as massage therapists or acupuncturists.  Psychiatrist for cognitive behavioral therapy.   Follow these instructions at home: Medicines  Take over-the-counter and prescription medicines only as told by your health care provider.  Do not drive or use heavy machinery while taking prescription pain medicine.  If you are taking prescription pain medicine, take actions to prevent or treat constipation. Your  health care provider may recommend that you: ? Drink enough fluid to keep  your urine pale yellow. ? Eat foods that are high in fiber, such as fresh fruits and vegetables, whole grains, and beans. ? Limit foods that are high in fat and processed sugars, such as fried or sweet foods. ? Take an over-the-counter or prescription medicine for constipation. Lifestyle  Exercise as directed by your health care provider or physical therapist.  Practice relaxation techniques to control your stress. You may want to try: ? Biofeedback. ? Visual imagery. ? Hypnosis. ? Muscle relaxation. ? Yoga. ? Meditation.  Maintain a healthy lifestyle. This includes eating a healthy diet and getting enough sleep.  Do not use any products that contain nicotine or tobacco, such as cigarettes and e-cigarettes. If you need help quitting, ask your health care provider.   General instructions  Talk to your health care provider about complementary treatments, such as acupuncture or massage.  Consider joining a support group with others who are diagnosed with this condition.  Do not do activities that stress or strain your muscles. This includes repetitive motions and heavy lifting.  Keep all follow-up visits as told by your health care provider. This is important. Where to find more information  National Fibromyalgia Association: www.fmaware.[place]: www.arthritis.org  American Chronic Pain Association: www.theacpa.org Contact a health care provider if:  You have new symptoms.  Your symptoms get worse or your pain is severe.  You have side effects from your medicines.  You have trouble sleeping.  Your condition is causing depression or anxiety. Summary  Myofascial pain syndrome and fibromyalgia are pain disorders.  Myofascial pain syndrome has tender points in the muscle that will cause pain when pressed (trigger points). Fibromyalgia also has muscle pains and tenderness that come and go, but this condition is often associated with fatigue and sleep  disturbances.  Fibromyalgia and myofascial pain syndrome are not the same but often occur together, causing pain and fatigue that make day-to-day activities difficult.  Treatment for fibromyalgia includes taking medicines to relax the muscles and medicines for pain, depression, or seizures. Treatment for myofascial pain syndrome includes taking medicines for pain, cooling and stretching of muscles, and injecting medicines into trigger points.  Follow your health care provider's instructions for taking medicines and maintaining a healthy lifestyle. This information is not intended to replace advice given to you by your health care provider. Make sure you discuss any questions you have with your health care provider. Document Revised: 07/29/2018 Document Reviewed: 04/21/2017 Elsevier Patient Education  2021 [employer].

## 2020-06-05 NOTE — Progress Notes (Signed)
Subjective:    Patient ID: Katrina Clements, female    DOB: 02-Jul-1965, 55 y.o.   MRN: [id number]  HPI  Pt is a 55 yo obese female with fibromyalgia, hypothyroidism, vitamin D def, dyslipidemia who presents to the clinic for medication refill and follow up.   Pt just had labs and needs refills.   She tried the cymbalta and had GREAT results. She felt the best she has in years but she was also nauseated all the time. Nausea stopped with stopping medication. Cannot tolerate nausea. She is in a lot of fibromyalgia flare pain. she has to take tylenol to sleep at night. She is achy all over. Most days are a struggle.   Pt is concerned because for the last 2 months she has had chest pain that radiates from sternum left and right. Her mother and brother both have had heart attacks. She does not feel like occurs with exertion. No SOB. Lasts for a few minutes then resolves. No cough. Does not want to be on cholesterol medication despite her risk.    .. Active Ambulatory Problems    Diagnosis Date Noted  . Hypothyroidism due to Hashimoto's thyroiditis 12/08/2013  . Hyperlipidemia 12/08/2013  . Fibromyalgia 12/08/2013  . 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 Dyslipidemia (high LDL; low HDL) 08/27/2016  . Cervical herniated disc 04/06/2018  . Vitamin D deficiency 10/10/2018  . Hypocalcemia 10/10/2018  . Numbness and tingling in right hand 10/10/2018  . Bilateral carpal tunnel syndrome 11/28/2018  . Arthralgia 12/08/2019  . Class 1 obesity due to excess calories without serious comorbidity with body mass index (BMI) of 34.0 to 34.9 in adult 12/08/2019  . Chest pain 06/05/2020  . Family history of heart attack 06/05/2020   Resolved Ambulatory Problems    Diagnosis Date Noted  . Acquired hypothyroidism 10/10/2018   Past Medical History:  Diagnosis Date  . Adenomyosis   . Allergy   . Endometriosis   . Thyroid disease    .Marland Kitchen Family History  Problem Relation Age of Onset  . Heart attack Mother    . Rheum arthritis Mother   . Diabetes Father   . Cancer Father        prostate  . Heart attack Maternal Grandmother   . Stroke Maternal Grandfather   . Heart attack Brother   . Hypertension Brother   . Diabetes Sister       Review of Systems See HPI.     Objective:   Physical Exam Vitals reviewed.  Constitutional:      Appearance: Normal appearance. She is obese.  Cardiovascular:     Rate and Rhythm: Normal rate and regular rhythm.     Pulses: Normal pulses.     Heart sounds: Normal heart sounds.  Pulmonary:     Effort: Pulmonary effort is normal.     Breath sounds: Normal breath sounds.  Musculoskeletal:     Comments: Tenderness over 18/18 tender points.   Neurological:     General: No focal deficit present.     Mental Status: She is alert.  Psychiatric:        Mood and Affect: Mood normal.    FIQ- abnormal. Lots of fibromyalgia pain and flares.        Assessment & Plan:  Marland KitchenMarland KitchenConsuela was seen today for follow-up.  Diagnoses and all orders for this visit:  Hypothyroidism due to Hashimoto's thyroiditis -     levothyroxine (SYNTHROID) 137 MCG tablet; Take 1 tablet (137 mcg total) by mouth  daily before breakfast.  Encounter for screening mammogram for malignant neoplasm of breast -     MM 3D SCREEN BREAST BILATERAL  Cervical herniated disc -     meloxicam (MOBIC) 15 MG tablet; Take 1 tablet (15 mg total) by mouth daily as needed.  Vitamin D deficiency -     Vitamin D, Ergocalciferol, (DRISDOL) 1.25 MG (50000 UNIT) CAPS capsule; TAKE 1 CAPSULE EVERY 7 DAYS  Fibromyalgia -     desvenlafaxine (PRISTIQ) 50 MG 24 hr tablet; Take 1 tablet (50 mg total) by mouth daily. -     IgG Allergens(96) Foods  Chest pain, unspecified type -     Cardiac Stress Test: Informed Consent Details: Physician/Practitioner Attestation; Transcribe to consent form and obtain patient signature -     Exercise Tolerance Test  Family history of heart attack -     Cardiac Stress Test:  Informed Consent Details: Physician/Practitioner Attestation; Transcribe to consent form and obtain patient signature -     Exercise Tolerance Test  Dyslipidemia (high LDL; low HDL) -     Cardiac Stress Test: Informed Consent Details: Physician/Practitioner Attestation; Transcribe to consent form and obtain patient signature -     Exercise Tolerance Test   Discussed fibromyalgia. Encouraged massages and limiting sugars. Keep walking.  Try pristiq to see if can tolerate.   Reviewed labs. Refilled thyroid medication. Refilled mobic and vitamin d.   Pt not on statin with dyslipidemia and strong CV family hx. Strongly encouraged statin. Pt declined today. Will get stress test to check for any ischemic causes for chest pain. If normal consider GERD vs fibromyalgia vs costochondritis.   Follow up if CP changes or worsens or new symptoms.   Follow up in 6 months.

## 2020-06-10 ENCOUNTER — Telehealth: Payer: Self-pay | Admitting: *Deleted

## 2020-06-10 DIAGNOSIS — Z8249 Family history of ischemic heart disease and other diseases of the circulatory system: Secondary | ICD-10-CM

## 2020-06-10 DIAGNOSIS — R079 Chest pain, unspecified: Secondary | ICD-10-CM

## 2020-06-10 DIAGNOSIS — E785 Hyperlipidemia, unspecified: Secondary | ICD-10-CM

## 2020-06-10 NOTE — Telephone Encounter (Signed)
Exercise tolerance test reordered for "future".

## 2020-06-13 ENCOUNTER — Telehealth: Payer: Self-pay | Admitting: Neurology

## 2020-06-13 NOTE — Telephone Encounter (Signed)
Prior Authorization for Desvenlafaxine Succinate ER 50MG  er tablets submitted via covermymeds.   CaseId:[id number];Status:Approved;Review Type:Prior Auth;Coverage Start Date:05/14/2020;Coverage End Date:06/13/2021;

## 2020-06-17 ENCOUNTER — Other Ambulatory Visit (HOSPITAL_COMMUNITY)
Admission: RE | Admit: 2020-06-17 | Discharge: 2020-06-17 | Disposition: A | Discharge status: Home / Self Care | Payer: BC Managed Care – PPO | Source: Ambulatory Visit | Attending: Cardiovascular Disease | Admitting: Cardiovascular Disease

## 2020-06-17 DIAGNOSIS — Z20822 Contact with and (suspected) exposure to covid-19: Secondary | ICD-10-CM | POA: Diagnosis not present

## 2020-06-17 DIAGNOSIS — Z01812 Encounter for preprocedural laboratory examination: Secondary | ICD-10-CM | POA: Insufficient documentation

## 2020-06-18 LAB — SARS CORONAVIRUS 2 (TAT 6-24 HRS): SARS Coronavirus 2: NEGATIVE

## 2020-06-19 ENCOUNTER — Telehealth (HOSPITAL_COMMUNITY): Payer: Self-pay | Admitting: *Deleted

## 2020-06-19 NOTE — Telephone Encounter (Signed)
Close encounter 

## 2020-06-20 ENCOUNTER — Other Ambulatory Visit: Payer: Self-pay

## 2020-06-20 ENCOUNTER — Ambulatory Visit (HOSPITAL_COMMUNITY)
Admission: RE | Admit: 2020-06-20 | Discharge: 2020-06-20 | Disposition: A | Discharge status: Home / Self Care | Payer: BC Managed Care – PPO | Source: Ambulatory Visit | Attending: Cardiovascular Disease | Admitting: Cardiovascular Disease

## 2020-06-20 DIAGNOSIS — R079 Chest pain, unspecified: Secondary | ICD-10-CM

## 2020-06-20 DIAGNOSIS — Z8249 Family history of ischemic heart disease and other diseases of the circulatory system: Secondary | ICD-10-CM | POA: Diagnosis not present

## 2020-06-20 DIAGNOSIS — E785 Hyperlipidemia, unspecified: Secondary | ICD-10-CM | POA: Diagnosis not present

## 2020-06-20 LAB — EXERCISE TOLERANCE TEST
Estimated workload: 7 METS
Exercise duration (min): 5 min
Exercise duration (sec): 13 s
MPHR: 165 {beats}/min
Peak HR: 166 {beats}/min
Percent HR: 100 %
Rest HR: 73 {beats}/min

## 2020-06-21 ENCOUNTER — Other Ambulatory Visit: Payer: Self-pay | Admitting: Physician Assistant

## 2020-06-21 DIAGNOSIS — R9439 Abnormal result of other cardiovascular function study: Secondary | ICD-10-CM

## 2020-06-21 DIAGNOSIS — E785 Hyperlipidemia, unspecified: Secondary | ICD-10-CM

## 2020-06-21 DIAGNOSIS — Z8249 Family history of ischemic heart disease and other diseases of the circulatory system: Secondary | ICD-10-CM

## 2020-06-21 DIAGNOSIS — R079 Chest pain, unspecified: Secondary | ICD-10-CM

## 2020-06-21 NOTE — Progress Notes (Signed)
Katrina Clements,   Your stress test showed that you became very hypertensive with exercise.  There was some signs of heart strain and lack of blood supply with exertion. This is a borderline stress test. You need more work. Referral to cardiology was made.

## 2020-08-05 ENCOUNTER — Other Ambulatory Visit (INDEPENDENT_AMBULATORY_CARE_PROVIDER_SITE_OTHER): Payer: BC Managed Care – PPO

## 2020-08-05 ENCOUNTER — Encounter: Payer: Self-pay | Admitting: Cardiology

## 2020-08-05 ENCOUNTER — Ambulatory Visit (INDEPENDENT_AMBULATORY_CARE_PROVIDER_SITE_OTHER): Payer: BC Managed Care – PPO | Admitting: Cardiology

## 2020-08-05 ENCOUNTER — Other Ambulatory Visit: Payer: Self-pay

## 2020-08-05 VITALS — BP 144/74 | HR 63 | Ht 70.0 in | Wt 238.0 lb

## 2020-08-05 DIAGNOSIS — E669 Obesity, unspecified: Secondary | ICD-10-CM

## 2020-08-05 DIAGNOSIS — I10 Essential (primary) hypertension: Secondary | ICD-10-CM | POA: Diagnosis not present

## 2020-08-05 DIAGNOSIS — R9439 Abnormal result of other cardiovascular function study: Secondary | ICD-10-CM | POA: Diagnosis not present

## 2020-08-05 DIAGNOSIS — E785 Hyperlipidemia, unspecified: Secondary | ICD-10-CM | POA: Diagnosis not present

## 2020-08-05 DIAGNOSIS — R079 Chest pain, unspecified: Secondary | ICD-10-CM | POA: Diagnosis not present

## 2020-08-05 DIAGNOSIS — R0789 Other chest pain: Secondary | ICD-10-CM | POA: Diagnosis not present

## 2020-08-05 MED ORDER — METOPROLOL TARTRATE 100 MG PO TABS: ORAL_TABLET | ORAL | 0 refills | Status: DC

## 2020-08-05 NOTE — Progress Notes (Signed)
EKG order placed

## 2020-08-05 NOTE — Progress Notes (Signed)
Cardiology Office Note:    Date:  08/05/2020   ID:  Katrina Clements, DOB 03-04-1966, MRN [id number]  PCP:  Donella Stade, PA-C  Cardiologist:  Berniece Salines, DO  Electrophysiologist:  None   Referring MD: Donella Stade, PA-C   Chief Complaint  Patient presents with  . Chest Pain    History of Present Illness:    Katrina Clements is a 55 y.o. female with a hx of hyperlipidemia not on statin medication because she has had family members with the direction therefore she has deferred taking any statins, presents today to be evaluated at the request of her primary care provider due to her abnormal exercise tolerance test.   Patient reports that she has had uncontrolled coronary artery disease.  Apparently with her mother having an MI at the age of 61, brother in his 44s with MI.  She tells me that she has had intermittent left-sided chest discomfort.  She reported this to her PCP she was recommended to undergo exercise tolerance test.  She did get this stress test which was borderline with medication for imaging.  She still describes intermittent chest pain.  Past Medical History:  Diagnosis Date  . Adenomyosis   . Allergy    seasonal  . Endometriosis   . Hyperlipidemia   . Thyroid disease     Past Surgical History:  Procedure Laterality Date  . BLADDER SUSPENSION    . btl    . LAPAROSCOPY     x 2  . VAGINAL HYSTERECTOMY     total    Current Medications: Current Meds  Medication Sig  . fluticasone (FLONASE) 50 MCG/ACT nasal spray Place 1 spray into both nostrils as needed for allergies or rhinitis.  Marland Kitchen levothyroxine (SYNTHROID) 137 MCG tablet Take 1 tablet (137 mcg total) by mouth daily before breakfast.  . liothyronine (CYTOMEL) 5 MCG tablet Take 5 mcg by mouth daily.  . meloxicam (MOBIC) 15 MG tablet Take 15 mg by mouth as needed for pain.  . metoprolol tartrate (LOPRESSOR) 100 MG tablet Take 2 hours prior to CT  . Vitamin D, Ergocalciferol, (DRISDOL) 1.25 MG (50000 UNIT)  CAPS capsule TAKE 1 CAPSULE EVERY 7 DAYS     Allergies:   Augmentin [amoxicillin-pot clavulanate], Azithromycin, Cymbalta [duloxetine hcl], Gabapentin, Lyrica [pregabalin], and Keflex [cephalexin]   Social History   Socioeconomic History  . Marital status: Married    Spouse name: Not on file  . Number of children: Not on file  . Years of education: Not on file  . Highest education level: Not on file  Occupational History  . Not on file  Tobacco Use  . Smoking status: Never Smoker  . Smokeless tobacco: Never Used  Substance and Sexual Activity  . Alcohol use: Yes    Comment: very rarely  . Drug use: No  . Sexual activity: Yes    Partners: Male  Other Topics Concern  . Not on file  Social History Narrative  . Not on file   Social Determinants of Health   Financial Resource Strain: Not on file  Food Insecurity: Not on file  Transportation Needs: Not on file  Physical Activity: Not on file  Stress: Not on file  Social Connections: Not on file     Family History: The patient's family history includes Cancer in her father; Diabetes in her father and sister; Heart attack in her brother, maternal grandmother, and mother; Hypertension in her brother; Rheum arthritis in her mother; Stroke in her maternal  grandfather.  ROS:   Review of Systems  Constitution: Negative for decreased appetite, fever and weight gain.  HENT: Negative for congestion, ear discharge, hoarse voice and sore throat.   Eyes: Negative for discharge, redness, vision loss in right eye and visual halos.  Cardiovascular: Negative for chest pain, dyspnea on exertion, leg swelling, orthopnea and palpitations.  Respiratory: Negative for cough, hemoptysis, shortness of breath and snoring.   Endocrine: Negative for heat intolerance and polyphagia.  Hematologic/Lymphatic: Negative for bleeding problem. Does not bruise/bleed easily.  Skin: Negative for flushing, nail changes, rash and suspicious lesions.   Musculoskeletal: Negative for arthritis, joint pain, muscle cramps, myalgias, neck pain and stiffness.  Gastrointestinal: Negative for abdominal pain, bowel incontinence, diarrhea and excessive appetite.  Genitourinary: Negative for decreased libido, genital sores and incomplete emptying.  Neurological: Negative for brief paralysis, focal weakness, headaches and loss of balance.  Psychiatric/Behavioral: Negative for altered mental status, depression and suicidal ideas.  Allergic/Immunologic: Negative for HIV exposure and persistent infections.    EKGs/Labs/Other Studies Reviewed:    The following studies were reviewed today:   EKG:  The ekg ordered today demonstrates sinus rhythm, heart rate 63 bpm  Exercise tolerance test: 07/08/2018  The patient walked on a standard Bruce protocol treadmill test for 5 minutes and 13 seconds. She achieved a peak heart rate of 166 which is 100% predicted maximal heart rate.  The blood pressure response to exercise was markedly hypertensive.  At peak exercise there was approximately 2 mm of flat/upsloping ST segment depression. This ST depression resolved very quickly in the recovery phase and is likely due to her hypertensive response.  There were no arrhythmias  During the stress test. This is interpreted as a borderline stress test. She has ST segment depression in the inferior and lateral leads which I suspect is due to her marked hypertensive response but I cannot completely exclude ischemia  I would suggest an alternative study including imaging.     Recent Labs: 11/30/2019: TSH 0.65 05/01/2020: ALT 25; BUN 13; Creat 0.69; Potassium 5.2; Sodium 139  Recent Lipid Panel    Component Value Date/Time   CHOL 236 (H) 11/30/2019 1036   TRIG 153 (H) 11/30/2019 1036   HDL 38 (L) 11/30/2019 1036   CHOLHDL 6.2 (H) 11/30/2019 1036   VLDL 36 (H) 08/27/2015 1034   LDLCALC 168 (H) 11/30/2019 1036    Physical Exam:    VS:  BP (!) 144/74 (BP  Location: Right Arm, Patient Position: Sitting, Cuff Size: Large)   Pulse 63   Ht 5\' 10"  (1.778 m)   Wt 238 lb (108 kg)   SpO2 98%   BMI 34.15 kg/m     Wt Readings from Last 3 Encounters:  08/05/20 238 lb (108 kg)  06/05/20 239 lb (108.4 kg)  12/06/19 239 lb (108.4 kg)     GEN: Well nourished, well developed in no acute distress HEENT: Normal NECK: No JVD; No carotid bruits LYMPHATICS: No lymphadenopathy CARDIAC: S1S2 noted,RRR, no murmurs, rubs, gallops RESPIRATORY:  Clear to auscultation without rales, wheezing or rhonchi  ABDOMEN: Soft, non-tender, non-distended, +bowel sounds, no guarding. EXTREMITIES: No edema, No cyanosis, no clubbing MUSCULOSKELETAL:  No deformity  SKIN: Warm and dry NEUROLOGIC:  Alert and oriented x 3, non-focal PSYCHIATRIC:  Normal affect, good insight  ASSESSMENT:    1. Hyperlipidemia, unspecified hyperlipidemia type   2. Other chest pain   3. Abnormal stress ECG   4. Hypertension, unspecified type   5. Obesity (BMI 30-39.9)  PLAN:     1.  Reviewed prior exercise tolerance test I agree with a hypertensive response and need for alternate study with imaging.  At this time given that the patient is still experiencing chest discomfort and has risk factors which includes premature coronary artery disease, hyperlipidemia I like to proceed with an imaging test which I believe a coronary CTA would be appropriate at this time.  She has no IV Dye contrast allergy.  She is agreeable to proceed with this testing.  All of her questions has been answered.  2.  Blood pressure is elevated in the office today.  The patient is apprehensive about starting blood pressure medications.  Reviewed previous visits which show elevated blood pressure and also a hypertensive response to exercise.  But for now she prefers to do home blood pressure monitoring.  Should her blood pressure continue to stay elevated I would recommend starting the patient on hydrochlorothiazide 12.5  mg daily.  3.  Elevated lipid profile.  Review her most recent blood work patient is able to go to the bloodstream to 36 from HDL 38, triglyceride 153, LDL 168, non-HDL cholesterol 198.  I educated the patient that this makes hyperlipidemia.  I recommended statin but she has deferred.  Also get information for LP(a) at this time.   4.  The patient understands the need to lose weight with diet and exercise. We have discussed specific strategies for this.  The patient is in agreement with the above plan. The patient left the office in stable condition.  The patient will follow up in   Medication Adjustments/Labs and Tests Ordered: Current medicines are reviewed at length with the patient today.  Concerns regarding medicines are outlined above.  Orders Placed This Encounter  Procedures  . CT CORONARY MORPH W/CTA COR W/SCORE W/CA W/CM &/OR WO/CM  . CT CORONARY FRACTIONAL FLOW RESERVE DATA PREP  . CT CORONARY FRACTIONAL FLOW RESERVE FLUID ANALYSIS  . Lipoprotein A (LPA)  . Basic metabolic panel  . Magnesium   Meds ordered this encounter  Medications  . metoprolol tartrate (LOPRESSOR) 100 MG tablet    Sig: Take 2 hours prior to CT    Dispense:  1 tablet    Refill:  0    Patient Instructions  Medication Instructions:  Your physician recommends that you continue on your current medications as directed. Please refer to the Current Medication list given to you today.  *If you need a refill on your cardiac medications before your next appointment, please call your pharmacy*   Lab Work: Your physician recommends that you return for lab work: TODAY: Lpa 3-7 days before CT: BMET, Mag If you have labs (blood work) drawn today and your tests are completely normal, you will receive your results only by: Marland Kitchen MyChart Message (if you have MyChart) OR . A paper copy in the mail If you have any lab test that is abnormal or we need to change your treatment, we will call you to review the  results.   Testing/Procedures: Your cardiac CT will be scheduled at:  [facility] [address] [place], [place] [address] [phone]   If scheduled at [facility], please arrive at the [facility] main entrance (entrance A) of [facility] 30 minutes prior to test start time. Proceed to the [facility] Radiology Department (first floor) to check-in and test prep.  If scheduled at [facility], please arrive 15 mins early for check-in and test prep.  Please follow these instructions carefully (unless otherwise directed):  On the Night Before the Test: . Be sure to Drink plenty of water. . Do not consume any caffeinated/decaffeinated beverages or chocolate 12 hours prior to your test. . Do not take any antihistamines 12 hours prior to your test. .  On the Day of the Test: . Drink plenty of water until 1 hour prior to the test. . Do not eat any food 4 hours prior to the test. . You may take your regular medications prior to the test.  . Take metoprolol (Lopressor) two hours prior to test. . FEMALES- please wear underwire-free bra if available      After the Test: . Drink plenty of water. . After receiving IV contrast, you may experience a mild flushed feeling. This is normal. . On occasion, you may experience a mild rash up to 24 hours after the test. This is not dangerous. If this occurs, you can take Benadryl 25 mg and increase your fluid intake. . If you experience trouble breathing, this can be serious. If it is severe call 911 IMMEDIATELY. If it is mild, please call our office.  Once we have confirmed authorization from your insurance company, we will call you to set up a date and time for your test. Based on how quickly your insurance processes prior authorizations requests, please allow up to 4 weeks to be contacted for scheduling your Cardiac CT appointment. Be advised that routine Cardiac CT appointments could be  scheduled as many as 8 weeks after your provider has ordered it.  For non-scheduling related questions, please contact the cardiac imaging nurse navigator should you have any questions/concerns: Marchia Bond, Cardiac Imaging Nurse Navigator Gordy Clement, Cardiac Imaging Nurse Navigator [facility] Direct Office Dial: [phone]   For scheduling needs, including cancellations and rescheduling, please call [place], [phone].     Follow-Up: At [facility], you and your health needs are our priority.  As part of our continuing mission to provide you with exceptional heart care, we have created designated Provider Care Teams.  These Care Teams include your primary Cardiologist (physician) and Advanced Practice Providers (APPs -  Physician Assistants and Nurse Practitioners) who all work together to provide you with the care you need, when you need it.  We recommend signing up for the patient portal called "MyChart".  Sign up information is provided on this After Visit Summary.  MyChart is used to connect with patients for Virtual Visits (Telemedicine).  Patients are able to view lab/test results, encounter notes, upcoming appointments, etc.  Non-urgent messages can be sent to your provider as well.   To learn more about what you can do with MyChart, go to [email or web address].    Your next appointment:   3 month(s)  The format for your next appointment:   In Person  Provider:   Berniece Salines, DO   Other Instructions      Adopting a Healthy Lifestyle.  Know what a healthy weight is for you (roughly BMI <25) and aim to maintain this   Aim for 7+ servings of fruits and vegetables daily   65-80+ fluid ounces of water or unsweet tea for healthy kidneys   Limit to max 1 drink of alcohol per day; avoid smoking/tobacco   Limit animal fats in diet for cholesterol and heart health - choose grass fed whenever available   Avoid highly processed  foods, and foods high in saturated/trans fats   Aim for low stress -  take time to unwind and care for your mental health   Aim for 150 min of moderate intensity exercise weekly for heart health, and weights twice weekly for bone health   Aim for 7-9 hours of sleep daily   When it comes to diets, agreement about the perfect plan isnt easy to find, even among the experts. Experts at the [employer] developed an idea known as the Healthy Eating Plate. Just imagine a plate divided into logical, healthy portions.   The emphasis is on diet quality:   Load up on vegetables and fruits - one-half of your plate: Aim for color and variety, and remember that potatoes dont count.   Go for whole grains - one-quarter of your plate: Whole wheat, barley, wheat berries, quinoa, oats, brown rice, and foods made with them. If you want pasta, go with whole wheat pasta.   Protein power - one-quarter of your plate: Fish, chicken, beans, and nuts are all healthy, versatile protein sources. Limit red meat.   The diet, however, does go beyond the plate, offering a few other suggestions.   Use healthy plant oils, such as olive, canola, soy, corn, sunflower and peanut. Check the labels, and avoid partially hydrogenated oil, which have unhealthy trans fats.   If youre thirsty, drink water. Coffee and tea are good in moderation, but skip sugary drinks and limit milk and dairy products to one or two daily servings.   The type of carbohydrate in the diet is more important than the amount. Some sources of carbohydrates, such as vegetables, fruits, whole grains, and beans-are healthier than others.   Finally, stay active  Signed, Berniece Salines, DO  08/05/2020 12:10 PM    [facility]

## 2020-08-05 NOTE — Patient Instructions (Addendum)
Medication Instructions:  Your physician recommends that you continue on your current medications as directed. Please refer to the Current Medication list given to you today.  *If you need a refill on your cardiac medications before your next appointment, please call your pharmacy*   Lab Work: Your physician recommends that you return for lab work: TODAY: Lpa 3-7 days before CT: BMET, Mag If you have labs (blood work) drawn today and your tests are completely normal, you will receive your results only by: Marland Kitchen MyChart Message (if you have MyChart) OR . A paper copy in the mail If you have any lab test that is abnormal or we need to change your treatment, we will call you to review the results.   Testing/Procedures: Your cardiac CT will be scheduled at:  [facility] [address] [place], [place] [address] [phone]   If scheduled at [facility], please arrive at the [facility] main entrance (entrance A) of [facility] 30 minutes prior to test start time. Proceed to the [facility] Radiology Department (first floor) to check-in and test prep.  If scheduled at [facility], please arrive 15 mins early for check-in and test prep.  Please follow these instructions carefully (unless otherwise directed):  On the Night Before the Test: . Be sure to Drink plenty of water. . Do not consume any caffeinated/decaffeinated beverages or chocolate 12 hours prior to your test. . Do not take any antihistamines 12 hours prior to your test. .  On the Day of the Test: . Drink plenty of water until 1 hour prior to the test. . Do not eat any food 4 hours prior to the test. . You may take your regular medications prior to the test.  . Take metoprolol (Lopressor) two hours prior to test. . FEMALES- please wear underwire-free bra if available      After the Test: . Drink plenty of water. . After receiving IV contrast, you may experience a  mild flushed feeling. This is normal. . On occasion, you may experience a mild rash up to 24 hours after the test. This is not dangerous. If this occurs, you can take Benadryl 25 mg and increase your fluid intake. . If you experience trouble breathing, this can be serious. If it is severe call 911 IMMEDIATELY. If it is mild, please call our office.  Once we have confirmed authorization from your insurance company, we will call you to set up a date and time for your test. Based on how quickly your insurance processes prior authorizations requests, please allow up to 4 weeks to be contacted for scheduling your Cardiac CT appointment. Be advised that routine Cardiac CT appointments could be scheduled as many as 8 weeks after your provider has ordered it.  For non-scheduling related questions, please contact the cardiac imaging nurse navigator should you have any questions/concerns: Marchia Bond, Cardiac Imaging Nurse Navigator Gordy Clement, Cardiac Imaging Nurse Navigator [facility] Direct Office Dial: [phone]   For scheduling needs, including cancellations and rescheduling, please call [place], [phone].     Follow-Up: At [facility], you and your health needs are our priority.  As part of our continuing mission to provide you with exceptional heart care, we have created designated Provider Care Teams.  These Care Teams include your primary Cardiologist (physician) and Advanced Practice Providers (APPs -  Physician Assistants and Nurse Practitioners) who all work together to provide you with the care you  need, when you need it.  We recommend signing up for the patient portal called "MyChart".  Sign up information is provided on this After Visit Summary.  MyChart is used to connect with patients for Virtual Visits (Telemedicine).  Patients are able to view lab/test results, encounter notes, upcoming appointments, etc.  Non-urgent messages can be sent to  your provider as well.   To learn more about what you can do with MyChart, go to [email or web address].    Your next appointment:   3 month(s)  The format for your next appointment:   In Person  Provider:   Berniece Salines, DO   Other Instructions

## 2020-08-06 LAB — LIPOPROTEIN A (LPA): Lipoprotein (a): 128 nmol/L — ABNORMAL HIGH (ref <75.0)

## 2020-09-13 ENCOUNTER — Telehealth (HOSPITAL_COMMUNITY): Payer: Self-pay | Admitting: Emergency Medicine

## 2020-09-13 NOTE — Telephone Encounter (Signed)
Pt with cold symptoms and wishes to postpone test 1 wk.   New appt for 6/3 at 8:30a.   Marchia Bond RN Navigator Cardiac Imaging [facility] [phone] Office  [phone] Cell

## 2020-09-13 NOTE — Telephone Encounter (Signed)
Attempted to call patient regarding upcoming cardiac CT appointment. °Left message on voicemail with name and callback number °Mildred Tuccillo RN Navigator Cardiac Imaging °[facility] °[phone] Office °[phone] Cell ° °

## 2020-09-17 ENCOUNTER — Telehealth: Payer: Self-pay

## 2020-09-17 ENCOUNTER — Ambulatory Visit (HOSPITAL_COMMUNITY): Payer: BC Managed Care – PPO

## 2020-09-17 NOTE — Telephone Encounter (Signed)
Spoke with patient about getting her labs drawn before the CT on Friday. Patient verbalizes understanding. No further questions or concerns at this time.

## 2020-09-18 ENCOUNTER — Telehealth (HOSPITAL_COMMUNITY): Payer: Self-pay | Admitting: *Deleted

## 2020-09-18 NOTE — Telephone Encounter (Signed)
Reaching out to patient to offer assistance regarding upcoming cardiac imaging study; pt verbalizes understanding of appt date/time, parking situation and where to check in, pre-test NPO status and medications ordered, and verified current allergies; name and call back number provided for further questions should they arise  Gordy Clement RN Navigator Cardiac Imaging [facility] [phone] office [phone] cell  Pt to take 100mg  metoprolol tartrate 2 hours prior to CT scan.

## 2020-09-19 LAB — BASIC METABOLIC PANEL
BUN/Creatinine Ratio: 16 (ref 9–23)
BUN: 14 mg/dL (ref 6–24)
CO2: 22 mmol/L (ref 20–29)
Calcium: 9.4 mg/dL (ref 8.7–10.2)
Chloride: 103 mmol/L (ref 96–106)
Creatinine, Ser: 0.87 mg/dL (ref 0.57–1.00)
Glucose: 95 mg/dL (ref 65–99)
Potassium: 4.9 mmol/L (ref 3.5–5.2)
Sodium: 144 mmol/L (ref 134–144)
eGFR: 79 mL/min/{1.73_m2} (ref >59)

## 2020-09-19 LAB — MAGNESIUM: Magnesium: 2.2 mg/dL (ref 1.6–2.3)

## 2020-09-20 ENCOUNTER — Ambulatory Visit (HOSPITAL_COMMUNITY)
Admission: RE | Admit: 2020-09-20 | Discharge: 2020-09-20 | Disposition: A | Discharge status: Home / Self Care | Payer: BC Managed Care – PPO | Source: Ambulatory Visit | Attending: Cardiology | Admitting: Cardiology

## 2020-09-20 ENCOUNTER — Other Ambulatory Visit: Payer: Self-pay

## 2020-09-20 DIAGNOSIS — R0789 Other chest pain: Secondary | ICD-10-CM | POA: Diagnosis not present

## 2020-09-20 DIAGNOSIS — R9439 Abnormal result of other cardiovascular function study: Secondary | ICD-10-CM | POA: Insufficient documentation

## 2020-09-20 MED ORDER — IOHEXOL 350 MG/ML SOLN
95.0000 mL | Freq: Once | INTRAVENOUS | Status: AC | PRN
  Administered 2020-09-20: 95 mL via INTRAVENOUS

## 2020-09-20 MED ORDER — NITROGLYCERIN 0.4 MG SL SUBL
0.8000 mg | SUBLINGUAL_TABLET | Freq: Once | SUBLINGUAL | Status: AC
  Administered 2020-09-20: 0.8 mg via SUBLINGUAL

## 2020-09-20 MED ORDER — NITROGLYCERIN 0.4 MG SL SUBL
SUBLINGUAL_TABLET | SUBLINGUAL | Status: AC
  Filled 2020-09-20: qty 2

## 2020-09-24 MED ORDER — ROSUVASTATIN CALCIUM 10 MG PO TABS: 10.0000 mg | ORAL_TABLET | Freq: Every day | ORAL | 3 refills | Status: DC

## 2020-10-10 DIAGNOSIS — M797 Fibromyalgia: Secondary | ICD-10-CM | POA: Diagnosis not present

## 2020-10-12 LAB — ALLERGENS (95) FOODS IGG
C074-IgG Gelatin: <2 ug/mL (ref 0.0–1.9)
Casein IgG: 2.1 ug/mL — ABNORMAL HIGH (ref 0.0–1.9)
Chicken IgG: <2 ug/mL (ref 0.0–1.9)
Chili Pepper IgG: 7.4 ug/mL — ABNORMAL HIGH (ref 0.0–1.9)
Chocolate/Cacao IgG: 2.3 ug/mL — ABNORMAL HIGH (ref 0.0–1.9)
Coffee IgG: 4.8 ug/mL — ABNORMAL HIGH (ref 0.0–1.9)
Corn IgG: 4.3 ug/mL — ABNORMAL HIGH (ref 0.0–1.9)
F001-IgG Egg White: <2 ug/mL (ref 0.0–1.9)
F003-IgG Codfish: <2 ug/mL (ref 0.0–1.9)
F006-IgG Barley, Whole: 9 ug/mL — ABNORMAL HIGH (ref 0.0–1.9)
F009-IgG Rice: 6.1 ug/mL — ABNORMAL HIGH (ref 0.0–1.9)
F010-IgG Sesame Seed: 6.2 ug/mL — ABNORMAL HIGH (ref 0.0–1.9)
F012-IgG Green Pea: <2 ug/mL (ref 0.0–1.9)
F015-IgG White Bean: <2 ug/mL (ref 0.0–1.9)
F020-IgG Almond: <2 ug/mL (ref 0.0–1.9)
F023-IgG Crab: 4.3 ug/mL — ABNORMAL HIGH (ref 0.0–1.9)
F027-IgG Beef: 10.1 ug/mL — ABNORMAL HIGH (ref 0.0–1.9)
F031-IgG Carrot: <2 ug/mL (ref 0.0–1.9)
F033-IgG Orange: <2 ug/mL (ref 0.0–1.9)
F036-IgG Coconut: <2 ug/mL (ref 0.0–1.9)
F040-IgG Tuna: <2 ug/mL (ref 0.0–1.9)
F041-IgG Salmon: <2 ug/mL (ref 0.0–1.9)
F044-IgG Strawberry: 2.3 ug/mL — ABNORMAL HIGH (ref 0.0–1.9)
F047-IgG Garlic: 5.4 ug/mL — ABNORMAL HIGH (ref 0.0–1.9)
F049-IgG Apple: <2 ug/mL (ref 0.0–1.9)
F054-IgG Sweet Potato: <2 ug/mL (ref 0.0–1.9)
F075-IgG Egg (Yolk): <2 ug/mL (ref 0.0–1.9)
F076-IgG Alpha Lactalbumin: <2 ug/mL (ref 0.0–1.9)
F077-IgG Beta Lactoglobulin: <2 ug/mL (ref 0.0–1.9)
F079-IgG Gluten: <2 ug/mL (ref 0.0–1.9)
F080-IgG Lobster: 3.5 ug/mL — ABNORMAL HIGH (ref 0.0–1.9)
F081-IgG Cheese, Cheddar Type: 2 ug/mL — ABNORMAL HIGH (ref 0.0–1.9)
F085-IgG Celery: <2 ug/mL (ref 0.0–1.9)
F087-IgG Melon: 2.6 ug/mL — ABNORMAL HIGH (ref 0.0–1.9)
F089-IgG Mustard: <2 ug/mL (ref 0.0–1.9)
F090-IgG Malt: 6.1 ug/mL — ABNORMAL HIGH (ref 0.0–1.9)
F092-IgG Banana: 5.8 ug/mL — ABNORMAL HIGH (ref 0.0–1.9)
F094-IgG Pear: <2 ug/mL (ref 0.0–1.9)
F095-IgG Peach: <2 ug/mL (ref 0.0–1.9)
F096-IgG Avocado: <2 ug/mL (ref 0.0–1.9)
F182-IgG Lima Bean: 2.1 ug/mL — ABNORMAL HIGH (ref 0.0–1.9)
F202-IgG Cashew Nut: <2 ug/mL (ref 0.0–1.9)
F207-IgG Clam: 4.1 ug/mL — ABNORMAL HIGH (ref 0.0–1.9)
F208-IgG Lemon: <2 ug/mL (ref 0.0–1.9)
F209-IgG Grapefruit: <2 ug/mL (ref 0.0–1.9)
F210-IgG Pineapple: <2 ug/mL (ref 0.0–1.9)
F212-IgG Mushroom: 5.5 ug/mL — ABNORMAL HIGH (ref 0.0–1.9)
F214-IgG Spinach: 2.3 ug/mL — ABNORMAL HIGH (ref 0.0–1.9)
F215-IgG Lettuce: 2.2 ug/mL — ABNORMAL HIGH (ref 0.0–1.9)
F216-IgG Cabbage: <2 ug/mL (ref 0.0–1.9)
F218-IgG Paprika/Sweet Pepper: 6.3 ug/mL — ABNORMAL HIGH (ref 0.0–1.9)
F220-IgG Cinnamon: 3.8 ug/mL — ABNORMAL HIGH (ref 0.0–1.9)
F222-IgG Tea: 3.7 ug/mL — ABNORMAL HIGH (ref 0.0–1.9)
F225-IgG Pumpkin: <2 ug/mL (ref 0.0–1.9)
F234-IgG Vanilla: 3 ug/mL — ABNORMAL HIGH (ref 0.0–1.9)
F236-IgG Whey: 6.5 ug/mL — ABNORMAL HIGH (ref 0.0–1.9)
F242-IgG Bing Cherry: <2 ug/mL (ref 0.0–1.9)
F244-IgG Cucumber: <2 ug/mL (ref 0.0–1.9)
F256-IgG Walnut: <2 ug/mL (ref 0.0–1.9)
F259-IgG Grape: <2 ug/mL (ref 0.0–1.9)
F260-IgG Broccoli: <2 ug/mL (ref 0.0–1.9)
F261-IgG Asparagus: <2 ug/mL (ref 0.0–1.9)
F262-IgG Eggplant: <2 ug/mL (ref 0.0–1.9)
F265-IgG Cumin: 3 ug/mL — ABNORMAL HIGH (ref 0.0–1.9)
F269-IgG Basil: <2 ug/mL (ref 0.0–1.9)
F270-IgG Ginger: <2 ug/mL (ref 0.0–1.9)
F278-IgG Bayleaf (Laurel): 2.2 ug/mL — ABNORMAL HIGH (ref 0.0–1.9)
F280-IgG Black Pepper: 16.7 ug/mL — ABNORMAL HIGH (ref 0.0–1.9)
F283-IgG Oregano: 3.7 ug/mL — ABNORMAL HIGH (ref 0.0–1.9)
F284-IgG Turkey: <2 ug/mL (ref 0.0–1.9)
F287-IgG Kidney Bean: <2 ug/mL (ref 0.0–1.9)
F288-IgG Blueberry: 4.6 ug/mL — ABNORMAL HIGH (ref 0.0–1.9)
F291-IgG Cauliflower: <2 ug/mL (ref 0.0–1.9)
F300-IgG Goat's Milk: 3 ug/mL — ABNORMAL HIGH (ref 0.0–1.9)
F306-IgG Lime: <2 ug/mL (ref 0.0–1.9)
F319-IgG Red Beet: 3.8 ug/mL — ABNORMAL HIGH (ref 0.0–1.9)
F324-IgG Hop (Food): 4.9 ug/mL — ABNORMAL HIGH (ref 0.0–1.9)
F329-IgG Watermelon: <2 ug/mL (ref 0.0–1.9)
F338-IgG Scallop: <2 ug/mL (ref 0.0–1.9)
F341-IgG Cranberry: <2 ug/mL (ref 0.0–1.9)
F342-IgG Olive, Black: 2.9 ug/mL — ABNORMAL HIGH (ref 0.0–1.9)
F343-IgG Raspberry: 2.5 ug/mL — ABNORMAL HIGH (ref 0.0–1.9)
Green Bean IgG: <2 ug/mL (ref 0.0–1.9)
Lamb IgG: 3.2 ug/mL — ABNORMAL HIGH (ref 0.0–1.9)
Oat IgG: 5.8 ug/mL — ABNORMAL HIGH (ref 0.0–1.9)
Onion IgG: 2.5 ug/mL — ABNORMAL HIGH (ref 0.0–1.9)
Peanut IgG: <2 ug/mL (ref 0.0–1.9)
Pork IgG: 3.3 ug/mL — ABNORMAL HIGH (ref 0.0–1.9)
Potato, White, IgG: <2 ug/mL (ref 0.0–1.9)
Rye IgG: <2 ug/mL (ref 0.0–1.9)
Shrimp IgG: 3.8 ug/mL — ABNORMAL HIGH (ref 0.0–1.9)
Soybean IgG: <2 ug/mL (ref 0.0–1.9)
Tomato IgG: <2 ug/mL (ref 0.0–1.9)
Wheat IgG: <2 ug/mL (ref 0.0–1.9)
Yeast IgG: 10.4 ug/mL — ABNORMAL HIGH (ref 0.0–1.9)

## 2020-10-14 NOTE — Progress Notes (Signed)
I would target elimination of black pepper, Beef, yeast and Barley and see if you feel any better.

## 2020-10-24 DIAGNOSIS — E079 Disorder of thyroid, unspecified: Secondary | ICD-10-CM | POA: Insufficient documentation

## 2020-10-24 DIAGNOSIS — N800 Endometriosis of uterus: Secondary | ICD-10-CM | POA: Insufficient documentation

## 2020-10-24 DIAGNOSIS — T7840XA Allergy, unspecified, initial encounter: Secondary | ICD-10-CM | POA: Insufficient documentation

## 2020-10-24 DIAGNOSIS — N8003 Adenomyosis of the uterus: Secondary | ICD-10-CM | POA: Insufficient documentation

## 2020-10-24 DIAGNOSIS — N809 Endometriosis, unspecified: Secondary | ICD-10-CM | POA: Insufficient documentation

## 2020-10-25 ENCOUNTER — Ambulatory Visit (INDEPENDENT_AMBULATORY_CARE_PROVIDER_SITE_OTHER): Payer: BC Managed Care – PPO | Admitting: Cardiology

## 2020-10-25 ENCOUNTER — Other Ambulatory Visit: Payer: Self-pay

## 2020-10-25 ENCOUNTER — Encounter: Payer: Self-pay | Admitting: Cardiology

## 2020-10-25 VITALS — BP 134/80 | HR 70 | Ht 70.0 in | Wt 232.0 lb

## 2020-10-25 DIAGNOSIS — E782 Mixed hyperlipidemia: Secondary | ICD-10-CM | POA: Diagnosis not present

## 2020-10-25 DIAGNOSIS — I10 Essential (primary) hypertension: Secondary | ICD-10-CM

## 2020-10-25 DIAGNOSIS — E669 Obesity, unspecified: Secondary | ICD-10-CM

## 2020-10-25 NOTE — Patient Instructions (Signed)
Medication Instructions:  Your physician recommends that you continue on your current medications as directed. Please refer to the Current Medication list given to you today.  *If you need a refill on your cardiac medications before your next appointment, please call your pharmacy*   Lab Work: Your physician recommends that you return for lab work in:  TODAY: Lipids, Lp(a)  If you have labs (blood work) drawn today and your tests are completely normal, you will receive your results only by: [place] (if you have [place]) OR A paper copy in the mail If you have any lab test that is abnormal or we need to change your treatment, we will call you to review the results.   Testing/Procedures: None   Follow-Up: At [facility], you and your health needs are our priority.  As part of our continuing mission to provide you with exceptional heart care, we have created designated Provider Care Teams.  These Care Teams include your primary Cardiologist (physician) and Advanced Practice Providers (APPs -  Physician Assistants and Nurse Practitioners) who all work together to provide you with the care you need, when you need it.  We recommend signing up for the patient portal called "MyChart".  Sign up information is provided on this After Visit Summary.  MyChart is used to connect with patients for Virtual Visits (Telemedicine).  Patients are able to view lab/test results, encounter notes, upcoming appointments, etc.  Non-urgent messages can be sent to your provider as well.   To learn more about what you can do with MyChart, go to [email or web address].    Your next appointment:   1 year(s)  The format for your next appointment:   In Person  Provider:   [address] - Berniece Salines, DO    Other Instructions

## 2020-10-25 NOTE — Progress Notes (Signed)
Cardiology Office Note:    Date:  10/25/2020   ID:  [name], DOB [birth year], MRN [id number]  PCP:  Donella Stade, PA-C  Cardiologist:  Berniece Salines, DO  Electrophysiologist:  None   Referring MD: Donella Stade, PA-C   " I am doing well"  History of Present Illness:    Katrina Clements is a 55 y.o. female with a hx of hyperlipidemia we have tried multiple statins the patient has had significant muscle leg pains and cramping and does not want to take any more statin medications, hypertension, obesity is her for a follow up visit.   At her last visit we sent the patient for coronary CTA as she was experiencing worsening chest discomfort.  She did get her testing which showed 0 calcium but no evidence of coronary artery disease.   She has not experiencing any more chest discomfort.  Past Medical History:  Diagnosis Date   Adenomyosis    Allergy    seasonal   Endometriosis    Hyperlipidemia    Thyroid disease     Past Surgical History:  Procedure Laterality Date   BLADDER SUSPENSION     btl     LAPAROSCOPY     x 2   VAGINAL HYSTERECTOMY     total    Current Medications: Current Meds  Medication Sig   fluticasone (FLONASE) 50 MCG/ACT nasal spray Place 1 spray into both nostrils as needed for allergies or rhinitis.   levothyroxine (SYNTHROID) 137 MCG tablet Take 1 tablet (137 mcg total) by mouth daily before breakfast.   liothyronine (CYTOMEL) 5 MCG tablet Take 5 mcg by mouth daily.   meloxicam (MOBIC) 15 MG tablet Take 15 mg by mouth as needed for pain.   Vitamin D, Ergocalciferol, (DRISDOL) 1.25 MG (50000 UNIT) CAPS capsule TAKE 1 CAPSULE EVERY 7 DAYS     Allergies:   Augmentin [amoxicillin-pot clavulanate], Azithromycin, Cymbalta [duloxetine hcl], Gabapentin, Lyrica [pregabalin], and Keflex [cephalexin]   Social History   Socioeconomic History   Marital status: Married    Spouse name: Not on file   Number of children: Not on file   Years of education: Not on  file   Highest education level: Not on file  Occupational History   Not on file  Tobacco Use   Smoking status: Never   Smokeless tobacco: Never  Substance and Sexual Activity   Alcohol use: Yes    Comment: very rarely   Drug use: No   Sexual activity: Yes    Partners: Male  Other Topics Concern   Not on file  Social History Narrative   Not on file   Social Determinants of Health   Financial Resource Strain: Not on file  Food Insecurity: Not on file  Transportation Needs: Not on file  Physical Activity: Not on file  Stress: Not on file  Social Connections: Not on file     Family History: The patient's family history includes Cancer in her father; Diabetes in her father and sister; Heart attack in her brother, maternal grandmother, and mother; Hypertension in her brother; Rheum arthritis in her mother; Stroke in her maternal grandfather.  ROS:   Review of Systems  Constitution: Negative for decreased appetite, fever and weight gain.  HENT: Negative for congestion, ear discharge, hoarse voice and sore throat.   Eyes: Negative for discharge, redness, vision loss in right eye and visual halos.  Cardiovascular: Negative for chest pain, dyspnea on exertion, leg swelling, orthopnea and palpitations.  Respiratory:  Negative for cough, hemoptysis, shortness of breath and snoring.   Endocrine: Negative for heat intolerance and polyphagia.  Hematologic/Lymphatic: Negative for bleeding problem. Does not bruise/bleed easily.  Skin: Negative for flushing, nail changes, rash and suspicious lesions.  Musculoskeletal: Negative for arthritis, joint pain, muscle cramps, myalgias, neck pain and stiffness.  Gastrointestinal: Negative for abdominal pain, bowel incontinence, diarrhea and excessive appetite.  Genitourinary: Negative for decreased libido, genital sores and incomplete emptying.  Neurological: Negative for brief paralysis, focal weakness, headaches and loss of balance.   Psychiatric/Behavioral: Negative for altered mental status, depression and suicidal ideas.  Allergic/Immunologic: Negative for HIV exposure and persistent infections.    EKGs/Labs/Other Studies Reviewed:    The following studies were reviewed today:   EKG:  The ekg ordered today demonstrates   CCTA 09/20/2020 FINDINGS: Coronary calcium score: The patient's coronary artery calcium score is 0, which places the patient in the 0 percentile.   Coronary arteries: Normal coronary origins.  Right dominance.   Right Coronary Artery: Normal caliber vessel, gives rise to PDA. No significant plaque or stenosis.   Left Main Coronary Artery: Normal caliber vessel. No significant plaque or stenosis.   Left Anterior Descending Coronary Artery: Normal caliber vessel. No significant plaque or stenosis. Gives rise to 2 diagonal branches.   Left Circumflex Artery: Normal caliber vessel. No significant plaque or stenosis. Gives rise to 3 OM branches.   Aorta: Normal size, 29 mm at the mid ascending aorta (level of the PA bifurcation) measured double oblique. No calcifications. No dissection.   Aortic Valve: No calcifications. Trileaflet.   Other findings:   Normal pulmonary vein drainage into the left atrium. Common left pulmonary vein trunk, normal variant.   Normal left atrial appendage without a thrombus.   Normal size of the pulmonary artery.   IMPRESSION: 1. No evidence of CAD, CADRADS = 0.   2. Coronary calcium score of 0. This was 0 percentile for age and sex matched control.   3. Normal coronary origin with right dominance.   INTERPRETATION:   1. CAD-RADS 0: No evidence of CAD (0%). Consider non-atherosclerotic causes of chest pain.   2. CAD-RADS 1: Minimal non-obstructive CAD (0-24%). Consider non-atherosclerotic causes of chest pain. Consider preventive therapy and risk factor modification.   3. CAD-RADS 2: Mild non-obstructive CAD (25-49%).  Consider non-atherosclerotic causes of chest pain. Consider preventive therapy and risk factor modification.   4. CAD-RADS 3: Moderate stenosis (50-69%). Consider symptom-guided anti-ischemic pharmacotherapy as well as risk factor modification per guideline directed care. Additional analysis with CT FFR will be submitted.   5. CAD-RADS 4: Severe stenosis. (70-99% or > 50% left main). Cardiac catheterization or CT FFR is recommended. Consider symptom-guided anti-ischemic pharmacotherapy as well as risk factor modification per guideline directed care. Invasive coronary angiography recommended with revascularization per published guideline statements.   6. CAD-RADS 5: Total coronary occlusion (100%). Consider cardiac catheterization or viability assessment. Consider symptom-guided anti-ischemic pharmacotherapy as well as risk factor modification per guideline directed care.   7. CAD-RADS N: Non-diagnostic study. Obstructive CAD can't be excluded. Alternative evaluation is recommended.     Electronically Signed   By: Buford Dresser M.D.   On: 09/20/2020 10:58    Addended by Buford Dresser, MD on 09/20/2020 11:00 AM    Study Result  Narrative & Impression  EXAM: OVER-READ INTERPRETATION  CT CHEST   The following report is an over-read performed by radiologist Dr. Vinnie Langton of [facility], [place] on 09/20/2020. This over-read does not include  interpretation of cardiac or coronary anatomy or pathology. The coronary calcium score/coronary CTA interpretation by the cardiologist is attached.   COMPARISON:  None.   FINDINGS: Within the visualized portions of the thorax there are no suspicious appearing pulmonary nodules or masses, there is no acute consolidative airspace disease, no pleural effusions, no pneumothorax and no lymphadenopathy. Visualized portions of the upper abdomen demonstrates diffuse low attenuation throughout the visualized hepatic  parenchyma. There are no aggressive appearing lytic or blastic lesions noted in the visualized portions of the skeleton.   IMPRESSION: 1. Hepatic steatosis.    Recent Labs: 11/30/2019: TSH 0.65 05/01/2020: ALT 25 09/18/2020: BUN 14; Creatinine, Ser 0.87; Magnesium 2.2; Potassium 4.9; Sodium 144  Recent Lipid Panel    Component Value Date/Time   CHOL 236 (H) 11/30/2019 1036   TRIG 153 (H) 11/30/2019 1036   HDL 38 (L) 11/30/2019 1036   CHOLHDL 6.2 (H) 11/30/2019 1036   VLDL 36 (H) 08/27/2015 1034   LDLCALC 168 (H) 11/30/2019 1036    Physical Exam:    VS:  BP 134/80 (BP Location: Right Wrist, Patient Position: Sitting, Cuff Size: Normal)   Pulse 70   Ht 5\' 10"  (1.778 m)   Wt 232 lb (105.2 kg)   SpO2 98%   BMI 33.29 kg/m     Wt Readings from Last 3 Encounters:  10/25/20 232 lb (105.2 kg)  08/05/20 238 lb (108 kg)  06/05/20 239 lb (108.4 kg)     GEN: Well nourished, well developed in no acute distress HEENT: Normal NECK: No JVD; No carotid bruits LYMPHATICS: No lymphadenopathy CARDIAC: S1S2 noted,RRR, no murmurs, rubs, gallops RESPIRATORY:  Clear to auscultation without rales, wheezing or rhonchi  ABDOMEN: Soft, non-tender, non-distended, +bowel sounds, no guarding. EXTREMITIES: No edema, No cyanosis, no clubbing MUSCULOSKELETAL:  No deformity  SKIN: Warm and dry NEUROLOGIC:  Alert and oriented x 3, non-focal PSYCHIATRIC:  Normal affect, good insight  ASSESSMENT:    1. Mixed hyperlipidemia   2. Hypertension, unspecified type   3. Obesity (BMI 30-39.9)    PLAN:    Chest pain has resolved no need for any further work-up.  We discussed her use of lipid-lowering medications.  Shared decision we will repeat her lipid profile as well as LP(a), this is still elevated given her statin intolerance she may be a good candidate for PCSK9 inhibitors at which time I am going to refer her to our lipid clinic.   The patient understands the need to lose weight with diet and  exercise. We have discussed specific strategies for this.  Blood pressure acceptable today.  The patient is in agreement with the above plan. The patient left the office in stable condition.  The patient will follow up in 1 year or sooner if needed.   Medication Adjustments/Labs and Tests Ordered: Current medicines are reviewed at length with the patient today.  Concerns regarding medicines are outlined above.  No orders of the defined types were placed in this encounter.  No orders of the defined types were placed in this encounter.   There are no Patient Instructions on file for this visit.   Adopting a Healthy Lifestyle.  Know what a healthy weight is for you (roughly BMI <25) and aim to maintain this   Aim for 7+ servings of fruits and vegetables daily   65-80+ fluid ounces of water or unsweet tea for healthy kidneys   Limit to max 1 drink of alcohol per day; avoid smoking/tobacco   Limit animal fats  in diet for cholesterol and heart health - choose grass fed whenever available   Avoid highly processed foods, and foods high in saturated/trans fats   Aim for low stress - take time to unwind and care for your mental health   Aim for 150 min of moderate intensity exercise weekly for heart health, and weights twice weekly for bone health   Aim for 7-9 hours of sleep daily   When it comes to diets, agreement about the perfect plan isnt easy to find, even among the experts. Experts at the [employer] developed an idea known as the Healthy Eating Plate. Just imagine a plate divided into logical, healthy portions.   The emphasis is on diet quality:   Load up on vegetables and fruits - one-half of your plate: Aim for color and variety, and remember that potatoes dont count.   Go for whole grains - one-quarter of your plate: Whole wheat, barley, wheat berries, quinoa, oats, brown rice, and foods made with them. If you want pasta, go with whole wheat pasta.    Protein power - one-quarter of your plate: Fish, chicken, beans, and nuts are all healthy, versatile protein sources. Limit red meat.   The diet, however, does go beyond the plate, offering a few other suggestions.   Use healthy plant oils, such as olive, canola, soy, corn, sunflower and peanut. Check the labels, and avoid partially hydrogenated oil, which have unhealthy trans fats.   If youre thirsty, drink water. Coffee and tea are good in moderation, but skip sugary drinks and limit milk and dairy products to one or two daily servings.   The type of carbohydrate in the diet is more important than the amount. Some sources of carbohydrates, such as vegetables, fruits, whole grains, and beans-are healthier than others.   Finally, stay active  Signed, Berniece Salines, DO  10/25/2020 8:50 AM    [facility]

## 2020-10-26 LAB — LIPID PANEL
Chol/HDL Ratio: 7.3 ratio — ABNORMAL HIGH (ref 0.0–4.4)
Cholesterol, Total: 232 mg/dL — ABNORMAL HIGH (ref 100–199)
HDL: 32 mg/dL — ABNORMAL LOW (ref >39)
LDL Chol Calc (NIH): 161 mg/dL — ABNORMAL HIGH (ref 0–99)
Triglycerides: 211 mg/dL — ABNORMAL HIGH (ref 0–149)
VLDL Cholesterol Cal: 39 mg/dL (ref 5–40)

## 2020-10-26 LAB — LIPOPROTEIN A (LPA): Lipoprotein (a): 104.5 nmol/L — ABNORMAL HIGH (ref <75.0)

## 2020-10-29 ENCOUNTER — Telehealth: Payer: Self-pay | Admitting: Cardiology

## 2020-10-29 ENCOUNTER — Other Ambulatory Visit: Payer: Self-pay

## 2020-10-29 DIAGNOSIS — E782 Mixed hyperlipidemia: Secondary | ICD-10-CM

## 2020-10-29 NOTE — Telephone Encounter (Signed)
Spoke with patient, see chart.    

## 2020-10-29 NOTE — Telephone Encounter (Signed)
Patient is returning call to discuss lab results. 

## 2020-10-29 NOTE — Progress Notes (Signed)
Referral made to lipid clinic.

## 2020-11-05 ENCOUNTER — Telehealth: Payer: Self-pay

## 2020-11-05 ENCOUNTER — Ambulatory Visit (INDEPENDENT_AMBULATORY_CARE_PROVIDER_SITE_OTHER): Payer: BC Managed Care – PPO | Admitting: Pharmacist

## 2020-11-05 ENCOUNTER — Other Ambulatory Visit: Payer: Self-pay

## 2020-11-05 VITALS — BP 124/60 | HR 69 | Resp 15 | Ht 70.0 in | Wt 232.0 lb

## 2020-11-05 DIAGNOSIS — T466X5A Adverse effect of antihyperlipidemic and antiarteriosclerotic drugs, initial encounter: Secondary | ICD-10-CM

## 2020-11-05 DIAGNOSIS — E782 Mixed hyperlipidemia: Secondary | ICD-10-CM | POA: Diagnosis not present

## 2020-11-05 DIAGNOSIS — M791 Myalgia, unspecified site: Secondary | ICD-10-CM | POA: Diagnosis not present

## 2020-11-05 DIAGNOSIS — I10 Essential (primary) hypertension: Secondary | ICD-10-CM

## 2020-11-05 MED ORDER — NEXLIZET 180-10 MG PO TABS: 180.0000 mg | ORAL_TABLET | Freq: Every day | ORAL | 11 refills | Status: DC

## 2020-11-05 MED ORDER — ICOSAPENT ETHYL 1 G PO CAPS: 2.0000 g | ORAL_CAPSULE | Freq: Two times a day (BID) | ORAL | 11 refills | Status: DC

## 2020-11-05 NOTE — Patient Instructions (Addendum)
It was nice meeting you today!  We would like your LDL (bad cholesterol) to be less than 70 and your triglycerides less than 150  Continue physical activity and heart healthy foods like vegetables  We will start a new medication called Vascepa for your triglycerides and Nexlizet for your LDL.  We will recheck your cholesterol panel in about 3 months.  If we do not achieve the desired result, we will switch to the injections you take every other week  Please call with any questions!  Karren Cobble, PharmD, BCACP, [place], [place] [address], [place], [place] [address] Phone: [phone]; Fax: [phone] 11/05/2020 8:54 AM

## 2020-11-05 NOTE — Telephone Encounter (Signed)
[place]ry AND EMAILED TO PATIENT

## 2020-11-05 NOTE — Telephone Encounter (Signed)
Cmm questions completed for vascepa and nexlizet

## 2020-11-05 NOTE — Addendum Note (Signed)
Addended by: Allean Found on: 11/05/2020 10:33 AM   Modules accepted: Orders

## 2020-11-05 NOTE — Telephone Encounter (Signed)
Pas started for nexlizet (Key: [id number]) Pas started for vascepa (Key: [id number]) BOTH HAVE NOT YET POPULATED QUESTIONS AS OF YET.

## 2020-11-05 NOTE — Telephone Encounter (Signed)
Called and lmomed the pt that the pa for nexlizet approved until 11/05/21 and the vascepa was also approved until 11/05/21. I was able to get a vascepa savings card for the pt and called into her pharmacy. I instructed the pt in my msg to complete fasting labs after 2-3 months of being on the medications.

## 2020-11-05 NOTE — Progress Notes (Signed)
Patient ID: Katrina Clements                 DOB: 12-26-1965                    MRN: [id number]     HPI: Katrina Clements is a 55 y.o. female patient referred to lipid clinic by Dr Harriet Masson. PMH is significant for HLD, CAD, obesity, HTN, hypothyroidism, and statin intolerance. Coronary calcium score of 0.  Patient presents today in good spirits. Does not know why lipid levels are elevated but believes there may be a genetic component.  Mother and brother had MI.  Eats mostly vegetables and chicken.  Is currently in the process of restoring a farmhouse so is very physically active.  Non smoker, very little alcohol.    Has a history of fibromyalgia so is hesitant to start statin therapy.  Current Medications: n/a Intolerances: Crestor, Lipitor Risk Factors: CAD,  LDL goal: <70  Family History: Mother: MI at 55, Brother: MI at 16  Social History: no smoking  Labs: LDL 161, Trigs 211, lipoprotein a 104.5, TC 232 (10/25/20 - not on any lipid lowering therapies)  Past Medical History:  Diagnosis Date   Adenomyosis    Allergy    seasonal   Endometriosis    Hyperlipidemia    Thyroid disease     Current Outpatient Medications on File Prior to Visit  Medication Sig Dispense Refill   fluticasone (FLONASE) 50 MCG/ACT nasal spray Place 1 spray into both nostrils as needed for allergies or rhinitis.     levothyroxine (SYNTHROID) 137 MCG tablet Take 1 tablet (137 mcg total) by mouth daily before breakfast. 90 tablet 1   liothyronine (CYTOMEL) 5 MCG tablet Take 5 mcg by mouth daily.     meloxicam (MOBIC) 15 MG tablet Take 15 mg by mouth as needed for pain.     Vitamin D, Ergocalciferol, (DRISDOL) 1.25 MG (50000 UNIT) CAPS capsule TAKE 1 CAPSULE EVERY 7 DAYS 12 capsule 3   No current facility-administered medications on file prior to visit.    Allergies  Allergen Reactions   Augmentin [Amoxicillin-Pot Clavulanate]     Hives-regular amoxicillin is tolerated   Azithromycin     Yeast infection    Crestor [Rosuvastatin] Other (See Comments)    Muscle pain   Cymbalta [Duloxetine Hcl]     nausea   Gabapentin     Reflux   Lipitor [Atorvastatin] Other (See Comments)    Muscle pain   Lyrica [Pregabalin]     Reflux   Keflex [Cephalexin] Rash    Assessment/Plan:  1. HLD: Patient LDL 161 which is above goal of <70 and triglycerides 211 which is above goal of <150.  Will need to start pharmacologic therapy since she is not currently on a statin and does not want to be.  Discussed further options such as Nexlizet and PCSK9i.  Described mechanism of action of both and educated patient how to use Repatha demo pen.  Patient prefers to try Nexlizet first.  Will complete PA.  Recommended starting a triglyceride lowering agent such as fenofibrate or Vascepa.  Patient willing to start Vascepa as well.    If lipid panels do not reach goal, will add on PCSK9i.  Will recheck lipid panel in 3 months  Start Vascepa 2g BID with food Start Nexlizet 180/10 once daily Recheck lipids in 3 months  Karren Cobble, PharmD, BCACP, [place], [place] [address], [place]  [address] Phone: [phone]; Fax: [phone] 11/05/2020 9:26 AM

## 2020-11-25 ENCOUNTER — Telehealth: Payer: Self-pay | Admitting: Neurology

## 2020-11-25 DIAGNOSIS — Z0000 Encounter for general adult medical examination without abnormal findings: Secondary | ICD-10-CM

## 2020-11-25 DIAGNOSIS — Z131 Encounter for screening for diabetes mellitus: Secondary | ICD-10-CM

## 2020-11-25 DIAGNOSIS — E559 Vitamin D deficiency, unspecified: Secondary | ICD-10-CM

## 2020-11-25 DIAGNOSIS — Z79899 Other long term (current) drug therapy: Secondary | ICD-10-CM

## 2020-11-25 DIAGNOSIS — E038 Other specified hypothyroidism: Secondary | ICD-10-CM

## 2020-11-25 NOTE — Telephone Encounter (Signed)
Patient left vm wanting to get her labs before her physical on Wednesday at [facility].   Patient has had some labs recently from other providers. Not sure what she would need. Please advise.

## 2020-11-26 DIAGNOSIS — Z0000 Encounter for general adult medical examination without abnormal findings: Secondary | ICD-10-CM | POA: Diagnosis not present

## 2020-11-26 DIAGNOSIS — E038 Other specified hypothyroidism: Secondary | ICD-10-CM | POA: Diagnosis not present

## 2020-11-26 DIAGNOSIS — E559 Vitamin D deficiency, unspecified: Secondary | ICD-10-CM | POA: Diagnosis not present

## 2020-11-26 DIAGNOSIS — Z79899 Other long term (current) drug therapy: Secondary | ICD-10-CM | POA: Diagnosis not present

## 2020-11-26 DIAGNOSIS — E063 Autoimmune thyroiditis: Secondary | ICD-10-CM | POA: Diagnosis not present

## 2020-11-26 NOTE — Telephone Encounter (Signed)
Labs ordered. Patient made aware. 

## 2020-11-27 ENCOUNTER — Encounter: Payer: Self-pay | Admitting: Physician Assistant

## 2020-11-27 ENCOUNTER — Other Ambulatory Visit: Payer: Self-pay

## 2020-11-27 ENCOUNTER — Ambulatory Visit (INDEPENDENT_AMBULATORY_CARE_PROVIDER_SITE_OTHER): Payer: BC Managed Care – PPO | Admitting: Physician Assistant

## 2020-11-27 VITALS — BP 124/72 | HR 69 | Ht 70.0 in | Wt 228.0 lb

## 2020-11-27 DIAGNOSIS — Z0000 Encounter for general adult medical examination without abnormal findings: Secondary | ICD-10-CM | POA: Diagnosis not present

## 2020-11-27 DIAGNOSIS — E785 Hyperlipidemia, unspecified: Secondary | ICD-10-CM

## 2020-11-27 DIAGNOSIS — E038 Other specified hypothyroidism: Secondary | ICD-10-CM | POA: Diagnosis not present

## 2020-11-27 DIAGNOSIS — I10 Essential (primary) hypertension: Secondary | ICD-10-CM

## 2020-11-27 DIAGNOSIS — M797 Fibromyalgia: Secondary | ICD-10-CM

## 2020-11-27 DIAGNOSIS — E063 Autoimmune thyroiditis: Secondary | ICD-10-CM

## 2020-11-27 DIAGNOSIS — Z23 Encounter for immunization: Secondary | ICD-10-CM | POA: Diagnosis not present

## 2020-11-27 LAB — CMP14+EGFR
ALT: 39 IU/L — ABNORMAL HIGH (ref 0–32)
AST: 28 IU/L (ref 0–40)
Albumin/Globulin Ratio: 1.4 (ref 1.2–2.2)
Albumin: 4.2 g/dL (ref 3.8–4.9)
Alkaline Phosphatase: 72 IU/L (ref 44–121)
BUN/Creatinine Ratio: 21 (ref 9–23)
BUN: 18 mg/dL (ref 6–24)
Bilirubin Total: 0.2 mg/dL (ref 0.0–1.2)
CO2: 24 mmol/L (ref 20–29)
Calcium: 9.2 mg/dL (ref 8.7–10.2)
Chloride: 103 mmol/L (ref 96–106)
Creatinine, Ser: 0.84 mg/dL (ref 0.57–1.00)
Globulin, Total: 3.1 g/dL (ref 1.5–4.5)
Glucose: 92 mg/dL (ref 65–99)
Potassium: 5 mmol/L (ref 3.5–5.2)
Sodium: 139 mmol/L (ref 134–144)
Total Protein: 7.3 g/dL (ref 6.0–8.5)
eGFR: 82 mL/min/{1.73_m2} (ref >59)

## 2020-11-27 LAB — CBC WITH DIFFERENTIAL/PLATELET
Basophils Absolute: 0.1 10*3/uL (ref 0.0–0.2)
Basos: 1 %
EOS (ABSOLUTE): 0.2 10*3/uL (ref 0.0–0.4)
Eos: 2 %
Hematocrit: 38.5 % (ref 34.0–46.6)
Hemoglobin: 12.6 g/dL (ref 11.1–15.9)
Immature Grans (Abs): 0 10*3/uL (ref 0.0–0.1)
Immature Granulocytes: 0 %
Lymphocytes Absolute: 3 10*3/uL (ref 0.7–3.1)
Lymphs: 32 %
MCH: 28.6 pg (ref 26.6–33.0)
MCHC: 32.7 g/dL (ref 31.5–35.7)
MCV: 88 fL (ref 79–97)
Monocytes Absolute: 0.9 10*3/uL (ref 0.1–0.9)
Monocytes: 10 %
Neutrophils Absolute: 5 10*3/uL (ref 1.4–7.0)
Neutrophils: 55 %
Platelets: 322 10*3/uL (ref 150–450)
RBC: 4.4 x10E6/uL (ref 3.77–5.28)
RDW: 13.2 % (ref 11.7–15.4)
WBC: 9.1 10*3/uL (ref 3.4–10.8)

## 2020-11-27 LAB — TSH: TSH: 0.366 u[IU]/mL — ABNORMAL LOW (ref 0.450–4.500)

## 2020-11-27 LAB — VITAMIN D 25 HYDROXY (VIT D DEFICIENCY, FRACTURES): Vit D, 25-Hydroxy: 55.1 ng/mL (ref 30.0–100.0)

## 2020-11-27 LAB — T4, FREE: Free T4: 1.34 ng/dL (ref 0.82–1.77)

## 2020-11-27 LAB — T3, FREE: T3, Free: 3.1 pg/mL (ref 2.0–4.4)

## 2020-11-27 NOTE — Patient Instructions (Signed)
Health Maintenance, Female Adopting a healthy lifestyle and getting preventive care are important in promoting health and wellness. Ask your health care provider about: The right schedule for you to have regular tests and exams. Things you can do on your own to prevent diseases and keep yourself healthy. What should I know about diet, weight, and exercise? Eat a healthy diet  Eat a diet that includes plenty of vegetables, fruits, low-fat dairy products, and lean protein. Do not eat a lot of foods that are high in solid fats, added sugars, or sodium.  Maintain a healthy weight Body mass index (BMI) is used to identify weight problems. It estimates body fat based on height and weight. Your health care provider can help determineyour BMI and help you achieve or maintain a healthy weight. Get regular exercise Get regular exercise. This is one of the most important things you can do for your health. Most adults should: Exercise for at least 150 minutes each week. The exercise should increase your heart rate and make you sweat (moderate-intensity exercise). Do strengthening exercises at least twice a week. This is in addition to the moderate-intensity exercise. Spend less time sitting. Even light physical activity can be beneficial. Watch cholesterol and blood lipids Have your blood tested for lipids and cholesterol at 55 years of age, then havethis test every 5 years. Have your cholesterol levels checked more often if: Your lipid or cholesterol levels are high. You are older than 55 years of age. You are at high risk for heart disease. What should I know about cancer screening? Depending on your health history and family history, you may need to have cancer screening at various ages. This may include screening for: Breast cancer. Cervical cancer. Colorectal cancer. Skin cancer. Lung cancer. What should I know about heart disease, diabetes, and high blood pressure? Blood pressure and heart  disease High blood pressure causes heart disease and increases the risk of stroke. This is more likely to develop in people who have high blood pressure readings, are of African descent, or are overweight. Have your blood pressure checked: Every 3-5 years if you are 18-39 years of age. Every year if you are 40 years old or older. Diabetes Have regular diabetes screenings. This checks your fasting blood sugar level. Have the screening done: Once every three years after age 40 if you are at a normal weight and have a low risk for diabetes. More often and at a younger age if you are overweight or have a high risk for diabetes. What should I know about preventing infection? Hepatitis B If you have a higher risk for hepatitis B, you should be screened for this virus. Talk with your health care provider to find out if you are at risk forhepatitis B infection. Hepatitis C Testing is recommended for: Everyone born from 1945 through 1965. Anyone with known risk factors for hepatitis C. Sexually transmitted infections (STIs) Get screened for STIs, including gonorrhea and chlamydia, if: You are sexually active and are younger than 55 years of age. You are older than 55 years of age and your health care provider tells you that you are at risk for this type of infection. Your sexual activity has changed since you were last screened, and you are at increased risk for chlamydia or gonorrhea. Ask your health care provider if you are at risk. Ask your health care provider about whether you are at high risk for HIV. Your health care provider may recommend a prescription medicine to help   prevent HIV infection. If you choose to take medicine to prevent HIV, you should first get tested for HIV. You should then be tested every 3 months for as long as you are taking the medicine. Pregnancy If you are about to stop having your period (premenopausal) and you may become pregnant, seek counseling before you get  pregnant. Take 400 to 800 micrograms (mcg) of folic acid every day if you become pregnant. Ask for birth control (contraception) if you want to prevent pregnancy. Osteoporosis and menopause Osteoporosis is a disease in which the bones lose minerals and strength with aging. This can result in bone fractures. If you are 65 years old or older, or if you are at risk for osteoporosis and fractures, ask your health care provider if you should: Be screened for bone loss. Take a calcium or vitamin D supplement to lower your risk of fractures. Be given hormone replacement therapy (HRT) to treat symptoms of menopause. Follow these instructions at home: Lifestyle Do not use any products that contain nicotine or tobacco, such as cigarettes, e-cigarettes, and chewing tobacco. If you need help quitting, ask your health care provider. Do not use street drugs. Do not share needles. Ask your health care provider for help if you need support or information about quitting drugs. Alcohol use Do not drink alcohol if: Your health care provider tells you not to drink. You are pregnant, may be pregnant, or are planning to become pregnant. If you drink alcohol: Limit how much you use to 0-1 drink a day. Limit intake if you are breastfeeding. Be aware of how much alcohol is in your drink. In the [place] one drink equals one 12 oz bottle of beer (355 mL), one 5 oz glass of wine (148 mL), or one 1 oz glass of hard liquor (44 mL). General instructions Schedule regular health, dental, and eye exams. Stay current with your vaccines. Tell your health care provider if: You often feel depressed. You have ever been abused or do not feel safe at home. Summary Adopting a healthy lifestyle and getting preventive care are important in promoting health and wellness. Follow your health care provider's instructions about healthy diet, exercising, and getting tested or screened for diseases. Follow your health care provider's  instructions on monitoring your cholesterol and blood pressure. This information is not intended to replace advice given to you by your health care provider. Make sure you discuss any questions you have with your healthcare provider. Document Revised: 03/30/2018 Document Reviewed: 03/30/2018 Elsevier Patient Education  2022 [employer]  

## 2020-11-27 NOTE — Progress Notes (Signed)
Subjective:     Katrina Clements is a 55 y.o. female and is here for a comprehensive physical exam. The patient reports no problems.  Social History   Socioeconomic History   Marital status: Married    Spouse name: Not on file   Number of children: Not on file   Years of education: Not on file   Highest education level: Not on file  Occupational History   Not on file  Tobacco Use   Smoking status: Never   Smokeless tobacco: Never  Substance and Sexual Activity   Alcohol use: Yes    Comment: very rarely   Drug use: No   Sexual activity: Yes    Partners: Male  Other Topics Concern   Not on file  Social History Narrative   Not on file   Social Determinants of Health   Financial Resource Strain: Not on file  Food Insecurity: Not on file  Transportation Needs: Not on file  Physical Activity: Not on file  Stress: Not on file  Social Connections: Not on file  Intimate Partner Violence: Not on file   Health Maintenance  Topic Date Due   Hepatitis C Screening  Never done   Zoster Vaccines- Shingrix (1 of 2) Never done   MAMMOGRAM  04/06/2020   INFLUENZA VACCINE  11/18/2020   COVID-19 Vaccine (4 - Booster) 12/13/2020 (Originally 07/13/2020)   HIV Screening  11/27/2021 (Originally 05/01/1980)   Fecal DNA (Cologuard)  05/11/2021   TETANUS/TDAP  05/12/2028   HPV VACCINES  Aged Out   Pneumococcal Vaccine 56-5 Years old  Discontinued    The following portions of the patient's history were reviewed and updated as appropriate: allergies, current medications, past family history, past medical history, past social history, past surgical history, and problem list.  Review of Systems A comprehensive review of systems was negative.   Objective:    BP 124/72   Pulse 69   Ht '5\' 10"'$  (1.778 m)   Wt 228 lb (103.4 kg)   SpO2 97%   BMI 32.71 kg/m  General appearance: alert, cooperative, appears stated age, and mildly obese Head: Normocephalic, without obvious abnormality,  atraumatic Eyes: conjunctivae/corneas clear. PERRL, EOM's intact. Fundi benign. Ears: normal TM's and external ear canals both ears Nose: Nares normal. Septum midline. Mucosa normal. No drainage or sinus tenderness. Throat: lips, mucosa, and tongue normal; teeth and gums normal Neck: no adenopathy, no carotid bruit, no JVD, supple, symmetrical, trachea midline, and thyroid not enlarged, symmetric, no tenderness/mass/nodules Back: symmetric, no curvature. ROM normal. No CVA tenderness. Lungs: clear to auscultation bilaterally Heart: regular rate and rhythm, S1, S2 normal, no murmur, click, rub or gallop Abdomen: soft, non-tender; bowel sounds normal; no masses,  no organomegaly Extremities: extremities normal, atraumatic, no cyanosis or edema Pulses: 2+ and symmetric Skin: Skin color, texture, turgor normal. No rashes or lesions Lymph nodes: Cervical, supraclavicular, and axillary nodes normal. Neurologic: Grossly normal   .. Depression screen [facility] 2/9 11/27/2020 06/05/2020 10/06/2018 10/15/2017 02/26/2017  Decreased Interest 0 0 0 0 1  Down, Depressed, Hopeless 0 0 0 0 0  PHQ - 2 Score 0 0 0 0 1  Altered sleeping - 1 0 0 -  Tired, decreased energy - '1 1 1 '$ -  Change in appetite - 0 0 0 -  Feeling bad or failure about yourself  - 0 0 0 -  Trouble concentrating - 0 0 0 -  Moving slowly or fidgety/restless - 0 1 0 -  Suicidal thoughts -  0 0 0 -  PHQ-9 Score - '2 2 1 '$ -  Difficult doing work/chores - Somewhat difficult Not difficult at all Somewhat difficult -   .. GAD 7 : Generalized Anxiety Score 06/05/2020 10/06/2018 10/15/2017  Nervous, Anxious, on Edge 1 0 0  Control/stop worrying 0 0 0  Worry too much - different things 0 0 0  Trouble relaxing 0 0 0  Restless 0 0 0  Easily annoyed or irritable '1 1 1  '$ Afraid - awful might happen 0 0 0  Total GAD 7 Score '2 1 1  '$ Anxiety Difficulty Not difficult at all Not difficult at all Not difficult at all     Assessment:    Healthy female exam.       Plan:    Marland KitchenMarland KitchenDelayah was seen today for follow-up.  Diagnoses and all orders for this visit:  Routine physical examination  Hypothyroidism due to Hashimoto's thyroiditis  Hypertension, unspecified type  Dyslipidemia (high LDL; low HDL)  Fibromyalgia  Need for shingles vaccine -     Varicella-zoster vaccine IM  .Marland Kitchen Discussed 150 minutes of exercise a week.  Encouraged vitamin D 1000 units and Calcium '1300mg'$  or 4 servings of dairy a day.  Fasting labs ordered.  PHQ no concerns.  Mammogram ordered.  Cologuard UTD. Covid vaccine x3.  Shingrix first dose given today. Follow up in 2-6 months.   Will send refills once labs results.   See After Visit Summary for Counseling Recommendations

## 2020-11-27 NOTE — Progress Notes (Signed)
Meagen,   Vitamin D looks great.  Kidney function looks great.  Glucose looks great.  TSH leaning towards hyper thyroid but free levels look great. Stay on same dose. Recheck in 6 months with labs.  One liver enzyme up. Taking tylenol lately, drinking any alcohol? Not too concerning but will need to be monitor especially if going to be consider for donating a kidney.

## 2020-12-10 ENCOUNTER — Other Ambulatory Visit: Payer: Self-pay

## 2020-12-10 MED ORDER — ICOSAPENT ETHYL 1 G PO CAPS: 2.0000 g | ORAL_CAPSULE | Freq: Two times a day (BID) | ORAL | 3 refills | Status: DC

## 2020-12-10 MED ORDER — NEXLIZET 180-10 MG PO TABS: 180.0000 mg | ORAL_TABLET | Freq: Every day | ORAL | 3 refills | Status: DC

## 2021-01-15 ENCOUNTER — Other Ambulatory Visit: Payer: Self-pay | Admitting: Physician Assistant

## 2021-01-15 DIAGNOSIS — E063 Autoimmune thyroiditis: Secondary | ICD-10-CM

## 2021-01-15 DIAGNOSIS — E038 Other specified hypothyroidism: Secondary | ICD-10-CM

## 2021-03-26 LAB — LIPID PANEL
Chol/HDL Ratio: 4.5 ratio — ABNORMAL HIGH (ref 0.0–4.4)
Cholesterol, Total: 165 mg/dL (ref 100–199)
HDL: 37 mg/dL — ABNORMAL LOW (ref >39)
LDL Chol Calc (NIH): 105 mg/dL — ABNORMAL HIGH (ref 0–99)
Triglycerides: 127 mg/dL (ref 0–149)
VLDL Cholesterol Cal: 23 mg/dL (ref 5–40)

## 2021-03-27 ENCOUNTER — Other Ambulatory Visit: Payer: Self-pay

## 2021-03-30 ENCOUNTER — Other Ambulatory Visit: Payer: Self-pay | Admitting: Physician Assistant

## 2021-06-03 ENCOUNTER — Other Ambulatory Visit: Payer: Self-pay | Admitting: Physician Assistant

## 2021-06-03 DIAGNOSIS — E559 Vitamin D deficiency, unspecified: Secondary | ICD-10-CM

## 2021-06-17 ENCOUNTER — Other Ambulatory Visit: Payer: Self-pay | Admitting: Physician Assistant

## 2021-07-22 ENCOUNTER — Other Ambulatory Visit: Payer: Self-pay | Admitting: *Deleted

## 2021-07-22 ENCOUNTER — Telehealth: Payer: Self-pay

## 2021-07-22 ENCOUNTER — Encounter: Payer: Self-pay | Admitting: Physician Assistant

## 2021-07-22 DIAGNOSIS — E038 Other specified hypothyroidism: Secondary | ICD-10-CM

## 2021-07-22 DIAGNOSIS — E063 Autoimmune thyroiditis: Secondary | ICD-10-CM

## 2021-07-22 MED ORDER — LEVOTHYROXINE SODIUM 137 MCG PO TABS: 137.0000 ug | ORAL_TABLET | Freq: Every day | ORAL | 0 refills | Status: DC

## 2021-07-23 NOTE — Telephone Encounter (Signed)
Rx refill request

## 2021-09-09 ENCOUNTER — Other Ambulatory Visit: Payer: Self-pay | Admitting: Physician Assistant

## 2021-10-07 ENCOUNTER — Other Ambulatory Visit: Payer: Self-pay

## 2021-10-20 ENCOUNTER — Other Ambulatory Visit: Payer: Self-pay | Admitting: Physician Assistant

## 2021-10-20 DIAGNOSIS — E063 Autoimmune thyroiditis: Secondary | ICD-10-CM

## 2021-11-11 ENCOUNTER — Other Ambulatory Visit: Payer: Self-pay | Admitting: Neurology

## 2021-11-11 DIAGNOSIS — E038 Other specified hypothyroidism: Secondary | ICD-10-CM

## 2021-11-11 NOTE — Telephone Encounter (Signed)
Patient called to get TSH ordered for [facility]. TSH ordered. Patient made aware.

## 2021-11-12 DIAGNOSIS — E063 Autoimmune thyroiditis: Secondary | ICD-10-CM | POA: Diagnosis not present

## 2021-11-12 DIAGNOSIS — E038 Other specified hypothyroidism: Secondary | ICD-10-CM | POA: Diagnosis not present

## 2021-11-13 LAB — TSH: TSH: 2.81 u[IU]/mL (ref 0.450–4.500)

## 2021-11-14 NOTE — Telephone Encounter (Signed)
Labs received from two days ago:   TSH 0.450 - 4.500 uIU/mL 2.810   Patient is out of Cytomel, took last pill today and needs refills. Okay to fill?

## 2021-11-14 NOTE — Addendum Note (Signed)
Addended byAnnamaria Helling on: 11/14/2021 03:03 PM   Modules accepted: Orders

## 2021-11-16 ENCOUNTER — Other Ambulatory Visit: Payer: Self-pay | Admitting: Physician Assistant

## 2021-11-16 DIAGNOSIS — E063 Autoimmune thyroiditis: Secondary | ICD-10-CM

## 2021-11-16 MED ORDER — LIOTHYRONINE SODIUM 5 MCG PO TABS
5.0000 ug | ORAL_TABLET | Freq: Every day | ORAL | 1 refills | Status: DC
Start: 2021-11-16 — End: 2021-11-28

## 2021-11-16 MED ORDER — LEVOTHYROXINE SODIUM 137 MCG PO TABS: 137.0000 ug | ORAL_TABLET | Freq: Every day | ORAL | 1 refills | Status: DC

## 2021-11-16 NOTE — Progress Notes (Signed)
Thyroid  looks great. Sent refills.

## 2021-11-28 ENCOUNTER — Ambulatory Visit (INDEPENDENT_AMBULATORY_CARE_PROVIDER_SITE_OTHER): Payer: BC Managed Care – PPO | Admitting: Physician Assistant

## 2021-11-28 ENCOUNTER — Encounter: Payer: Self-pay | Admitting: Physician Assistant

## 2021-11-28 VITALS — BP 104/82 | HR 73 | Ht 70.0 in | Wt 238.0 lb

## 2021-11-28 DIAGNOSIS — E038 Other specified hypothyroidism: Secondary | ICD-10-CM

## 2021-11-28 DIAGNOSIS — E785 Hyperlipidemia, unspecified: Secondary | ICD-10-CM

## 2021-11-28 DIAGNOSIS — Q828 Other specified congenital malformations of skin: Secondary | ICD-10-CM

## 2021-11-28 DIAGNOSIS — Z1211 Encounter for screening for malignant neoplasm of colon: Secondary | ICD-10-CM

## 2021-11-28 DIAGNOSIS — Z79899 Other long term (current) drug therapy: Secondary | ICD-10-CM

## 2021-11-28 DIAGNOSIS — Z1231 Encounter for screening mammogram for malignant neoplasm of breast: Secondary | ICD-10-CM | POA: Diagnosis not present

## 2021-11-28 DIAGNOSIS — E063 Autoimmune thyroiditis: Secondary | ICD-10-CM

## 2021-11-28 DIAGNOSIS — E559 Vitamin D deficiency, unspecified: Secondary | ICD-10-CM

## 2021-11-28 DIAGNOSIS — I10 Essential (primary) hypertension: Secondary | ICD-10-CM

## 2021-11-28 DIAGNOSIS — Z23 Encounter for immunization: Secondary | ICD-10-CM

## 2021-11-28 MED ORDER — NEXLIZET 180-10 MG PO TABS: 180.0000 mg | ORAL_TABLET | Freq: Every day | ORAL | 3 refills | Status: DC

## 2021-11-28 MED ORDER — LIOTHYRONINE SODIUM 5 MCG PO TABS: 5.0000 ug | ORAL_TABLET | Freq: Every day | ORAL | 1 refills | Status: DC

## 2021-11-28 NOTE — Progress Notes (Signed)
Established Patient Office Visit  Subjective   Patient ID: Katrina Clements, female    DOB: [birth year]  Age: 56 y.o. MRN: [id number]  Chief Complaint  Patient presents with   Follow-up    HPI Pt is a 56 yo obese female with hypothyroidism, HLD, HTN who presents to the clinic for yearly follow up.  She is doing ok. Home life has been stressful but she is managing. No SI/HC. She is not exercising.   Cholesterol medication was too expensive and did not get it.   .. 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   Dyslipidemia (high LDL; low HDL) 08/27/2016   Cervical herniated disc 04/06/2018   Vitamin D deficiency 10/10/2018   Hypocalcemia 10/10/2018   Numbness and tingling in right hand 10/10/2018   Bilateral carpal tunnel syndrome 11/28/2018   Arthralgia 12/08/2019   Class 1 obesity due to excess calories without serious comorbidity with body mass index (BMI) of 34.0 to 34.9 in adult 12/08/2019   Chest pain 06/05/2020   Family history of heart attack 06/05/2020   Abnormal stress ECG 08/05/2020   Hypertension 08/05/2020   Obesity (BMI 30-39.9) 08/05/2020   Adenomyosis 10/24/2020   Allergy 10/24/2020   Endometriosis 10/24/2020   Thyroid disease 10/24/2020   Resolved Ambulatory Problems    Diagnosis Date Noted   Acquired hypothyroidism 10/10/2018   No Additional Past Medical History     ROS See HPI.    Objective:     BP 104/82   Pulse 73   Ht '5\' 10"'$  (1.778 m)   Wt 238 lb (108 kg)   SpO2 97%   BMI 34.15 kg/m  BP Readings from Last 3 Encounters:  11/28/21 104/82  11/27/20 124/72  11/05/20 124/60   Wt Readings from Last 3 Encounters:  11/28/21 238 lb (108 kg)  11/27/20 228 lb (103.4 kg)  11/05/20 232 lb (105.2 kg)    ..    11/28/2021    8:15 AM 11/27/2020    7:22 AM 06/05/2020    7:47 AM 10/06/2018     9:02 AM 10/15/2017    9:08 AM  Depression screen PHQ 2/9  Decreased Interest 0 0 0 0 0  Down, Depressed, Hopeless 0 0 0 0 0  PHQ - 2 Score 0 0 0 0 0  Altered sleeping   1 0 0  Tired, decreased energy   '1 1 1  '$ Change in appetite   0 0 0  Feeling bad or failure about yourself    0 0 0  Trouble concentrating   0 0 0  Moving slowly or fidgety/restless   0 1 0  Suicidal thoughts   0 0 0  PHQ-9 Score   '2 2 1  '$ Difficult doing work/chores   Somewhat difficult Not difficult at all Somewhat difficult    Physical Exam Constitutional:      Appearance: Normal appearance. She is obese.  HENT:     Head: Normocephalic.  Cardiovascular:     Rate and Rhythm: Normal rate and regular rhythm.     Pulses: Normal pulses.  Pulmonary:     Effort: Pulmonary effort is normal.  Musculoskeletal:     Cervical back: Normal range of motion and neck supple.     Right lower leg: No edema.     Left lower leg: No edema.  Neurological:  General: No focal deficit present.     Mental Status: She is alert and oriented to person, place, and time.  Psychiatric:        Mood and Affect: Mood normal.     Cryotherapy Procedure Note  Pre-operative Diagnosis: skin tags  Post-operative Diagnosis: skin tags  Locations: neck  Indications: irritation and getting caught on jewelry    Procedure Details  History of allergy to iodine: no. Pacemaker? no.  Patient informed of risks (permanent scarring, infection, light or dark discoloration, bleeding, infection, weakness, numbness and recurrence of the lesion) and benefits of the procedure and verbal informed consent obtained.  The areas are treated with liquid nitrogen therapy, frozen until ice ball extended 2 mm beyond lesion, allowed to thaw, and treated again. The patient tolerated procedure well.  The patient was instructed on post-op care, warned that there may be blister formation, redness and pain. Recommend OTC analgesia as needed for  pain.  Condition: Stable  Complications: none.  Plan: 1. Instructed to keep the area dry and covered for 24-48h and clean thereafter. 2. Warning signs of infection were reviewed.   3. Recommended that the patient use OTC acetaminophen as needed for pain.        Assessment & Plan:  Marland KitchenMarland KitchenLeilanny was seen today for follow-up.  Diagnoses and all orders for this visit:  Hypertension, unspecified type -     COMPLETE METABOLIC PANEL WITH GFR  Medication management -     COMPLETE METABOLIC PANEL WITH GFR -     CBC with Differential/Platelet -     Vitamin D (25 hydroxy)  Encounter for screening mammogram for malignant neoplasm of breast -     MM 3D SCREEN BREAST BILATERAL  Colon cancer screening -     Cologuard  Accessory skin tags  Hypothyroidism due to Hashimoto's thyroiditis -     liothyronine (CYTOMEL) 5 MCG tablet; Take 1 tablet (5 mcg total) by mouth daily.  Need for shingles vaccine -     Varicella-zoster vaccine IM  Dyslipidemia (high LDL; low HDL) -     Bempedoic Acid-Ezetimibe (NEXLIZET) 180-10 MG TABS; Take 180 mg by mouth daily.  Fasting labs will wait until 6 months TSH just checked and looked great Continue on cytomel and synthroid Refilled nexlizet Shingles vaccine given today BP looks great Skin tags frozen today Encouraged patient to get mammogram and cologuard Discussed importance   Iran Planas, PA-C

## 2021-12-05 ENCOUNTER — Telehealth: Payer: Self-pay

## 2021-12-05 NOTE — Telephone Encounter (Signed)
Initiated Prior authorization [id number] 180-'10MG'$  tablets Via: Covermymeds Case/Key:[id number]  Status: Pending as of 12/05/21 Reason: Notified Pt via: Mychart

## 2022-01-01 ENCOUNTER — Ambulatory Visit (INDEPENDENT_AMBULATORY_CARE_PROVIDER_SITE_OTHER): Payer: BC Managed Care – PPO

## 2022-01-01 DIAGNOSIS — Z1231 Encounter for screening mammogram for malignant neoplasm of breast: Secondary | ICD-10-CM | POA: Diagnosis not present

## 2022-01-05 NOTE — Progress Notes (Signed)
Normal mammogram. Follow up in 1 year.

## 2022-02-12 ENCOUNTER — Ambulatory Visit: Payer: BC Managed Care – PPO | Attending: Cardiology | Admitting: Cardiology

## 2022-02-12 ENCOUNTER — Encounter: Payer: Self-pay | Admitting: Cardiology

## 2022-02-12 VITALS — BP 142/80 | HR 59 | Ht 70.0 in | Wt 240.8 lb

## 2022-02-12 DIAGNOSIS — E782 Mixed hyperlipidemia: Secondary | ICD-10-CM

## 2022-02-12 DIAGNOSIS — I10 Essential (primary) hypertension: Secondary | ICD-10-CM | POA: Diagnosis not present

## 2022-02-12 NOTE — Progress Notes (Signed)
Cardiology Office Note:    Date:  02/12/2022   ID:  Katrina Clements, DOB 1965-11-08, MRN [id number]  PCP:  Donella Stade, PA-C  Cardiologist:  Berniece Salines, DO  Electrophysiologist:  None   Referring MD: Donella Stade, PA-C   " I am doing well"  History of Present Illness:    Katrina Clements is a 56 y.o. female with a hx of hyperlipidemia we have tried multiple statins the patient has had significant muscle leg pains and cramping and does not want to take any more statin medications, hypertension, obesity is her for a follow up visit.   At her last visit we sent the patient for coronary CTA as she was experiencing worsening chest discomfort.  She did get her testing which showed 0 calcium but no evidence of coronary artery disease.   At her visit on October 25, 2020 she was doing well we repeated her lipid profile.  We talked about statin use but she deferred therefore we discussed other lipid-lowering agents.  She did see pharmacy.  In the meantime she did see the pharmacy clinic and was willing to start  Nexlizet 180/10 once daily and Vascepa 2 g twice daily with food.  She is doing well from a CV standpoint.  But tells me that her Vascepa caused significant diarrhea.  Says she had to stop this medication.  She is still taking the Nexlizet.    Past Medical History:  Diagnosis Date   Adenomyosis    Allergy    seasonal   Endometriosis    Hyperlipidemia    Thyroid disease     Past Surgical History:  Procedure Laterality Date   BLADDER SUSPENSION     btl     LAPAROSCOPY     x 2   VAGINAL HYSTERECTOMY     total    Current Medications: Current Meds  Medication Sig   Bempedoic Acid-Ezetimibe (NEXLIZET) 180-10 MG TABS Take 180 mg by mouth daily.   fluticasone (FLONASE) 50 MCG/ACT nasal spray Place 1 spray into both nostrils as needed for allergies or rhinitis.   ketotifen (ZADITOR) 0.025 % ophthalmic solution 1 drop 2 (two) times daily.   levothyroxine (SYNTHROID) 137 MCG  tablet Take 1 tablet (137 mcg total) by mouth daily before breakfast.   liothyronine (CYTOMEL) 5 MCG tablet Take 1 tablet (5 mcg total) by mouth daily.   Vitamin D, Ergocalciferol, (DRISDOL) 1.25 MG (50000 UNIT) CAPS capsule TAKE 1 CAPSULE EVERY 7 DAYS     Allergies:   Augmentin [amoxicillin-pot clavulanate], Azithromycin, Crestor [rosuvastatin], Cymbalta [duloxetine hcl], Gabapentin, Lipitor [atorvastatin], Lyrica [pregabalin], and Keflex [cephalexin]   Social History   Socioeconomic History   Marital status: Married    Spouse name: Not on file   Number of children: Not on file   Years of education: Not on file   Highest education level: Not on file  Occupational History   Not on file  Tobacco Use   Smoking status: Never   Smokeless tobacco: Never  Substance and Sexual Activity   Alcohol use: Yes    Comment: very rarely   Drug use: No   Sexual activity: Yes    Partners: Male  Other Topics Concern   Not on file  Social History Narrative   Not on file   Social Determinants of Health   Financial Resource Strain: Not on file  Food Insecurity: Not on file  Transportation Needs: Not on file  Physical Activity: Not on file  Stress: Not on  file  Social Connections: Not on file     Family History: The patient's family history includes Cancer in her father; Diabetes in her father and sister; Heart attack in her brother, maternal grandmother, and mother; Hypertension in her brother; Rheum arthritis in her mother; Stroke in her maternal grandfather.  ROS:   Review of Systems  Constitution: Negative for decreased appetite, fever and weight gain.  HENT: Negative for congestion, ear discharge, hoarse voice and sore throat.   Eyes: Negative for discharge, redness, vision loss in right eye and visual halos.  Cardiovascular: Negative for chest pain, dyspnea on exertion, leg swelling, orthopnea and palpitations.  Respiratory: Negative for cough, hemoptysis, shortness of breath and  snoring.   Endocrine: Negative for heat intolerance and polyphagia.  Hematologic/Lymphatic: Negative for bleeding problem. Does not bruise/bleed easily.  Skin: Negative for flushing, nail changes, rash and suspicious lesions.  Musculoskeletal: Negative for arthritis, joint pain, muscle cramps, myalgias, neck pain and stiffness.  Gastrointestinal: Negative for abdominal pain, bowel incontinence, diarrhea and excessive appetite.  Genitourinary: Negative for decreased libido, genital sores and incomplete emptying.  Neurological: Negative for brief paralysis, focal weakness, headaches and loss of balance.  Psychiatric/Behavioral: Negative for altered mental status, depression and suicidal ideas.  Allergic/Immunologic: Negative for HIV exposure and persistent infections.    EKGs/Labs/Other Studies Reviewed:    The following studies were reviewed today:   EKG:  The ekg ordered today demonstrates   CCTA 09/20/2020 FINDINGS: Coronary calcium score: The patient's coronary artery calcium score is 0, which places the patient in the 0 percentile.   Coronary arteries: Normal coronary origins.  Right dominance.   Right Coronary Artery: Normal caliber vessel, gives rise to PDA. No significant plaque or stenosis.   Left Main Coronary Artery: Normal caliber vessel. No significant plaque or stenosis.   Left Anterior Descending Coronary Artery: Normal caliber vessel. No significant plaque or stenosis. Gives rise to 2 diagonal branches.   Left Circumflex Artery: Normal caliber vessel. No significant plaque or stenosis. Gives rise to 3 OM branches.   Aorta: Normal size, 29 mm at the mid ascending aorta (level of the PA bifurcation) measured double oblique. No calcifications. No dissection.   Aortic Valve: No calcifications. Trileaflet.   Other findings:   Normal pulmonary vein drainage into the left atrium. Common left pulmonary vein trunk, normal variant.   Normal left atrial appendage  without a thrombus.   Normal size of the pulmonary artery.   IMPRESSION: 1. No evidence of CAD, CADRADS = 0.   2. Coronary calcium score of 0. This was 0 percentile for age and sex matched control.   3. Normal coronary origin with right dominance.   INTERPRETATION:   1. CAD-RADS 0: No evidence of CAD (0%). Consider non-atherosclerotic causes of chest pain.   2. CAD-RADS 1: Minimal non-obstructive CAD (0-24%). Consider non-atherosclerotic causes of chest pain. Consider preventive therapy and risk factor modification.   3. CAD-RADS 2: Mild non-obstructive CAD (25-49%). Consider non-atherosclerotic causes of chest pain. Consider preventive therapy and risk factor modification.   4. CAD-RADS 3: Moderate stenosis (50-69%). Consider symptom-guided anti-ischemic pharmacotherapy as well as risk factor modification per guideline directed care. Additional analysis with CT FFR will be submitted.   5. CAD-RADS 4: Severe stenosis. (70-99% or > 50% left main). Cardiac catheterization or CT FFR is recommended. Consider symptom-guided anti-ischemic pharmacotherapy as well as risk factor modification per guideline directed care. Invasive coronary angiography recommended with revascularization per published guideline statements.   6. CAD-RADS 5:  Total coronary occlusion (100%). Consider cardiac catheterization or viability assessment. Consider symptom-guided anti-ischemic pharmacotherapy as well as risk factor modification per guideline directed care.   7. CAD-RADS N: Non-diagnostic study. Obstructive CAD can't be excluded. Alternative evaluation is recommended.     Electronically Signed   By: Buford Dresser M.D.   On: 09/20/2020 10:58    Addended by Buford Dresser, MD on 09/20/2020 11:00 AM    Study Result  Narrative & Impression  EXAM: OVER-READ INTERPRETATION  CT CHEST   The following report is an over-read performed by radiologist Dr. Vinnie Langton of  [facility], [place] on 09/20/2020. This over-read does not include interpretation of cardiac or coronary anatomy or pathology. The coronary calcium score/coronary CTA interpretation by the cardiologist is attached.   COMPARISON:  None.   FINDINGS: Within the visualized portions of the thorax there are no suspicious appearing pulmonary nodules or masses, there is no acute consolidative airspace disease, no pleural effusions, no pneumothorax and no lymphadenopathy. Visualized portions of the upper abdomen demonstrates diffuse low attenuation throughout the visualized hepatic parenchyma. There are no aggressive appearing lytic or blastic lesions noted in the visualized portions of the skeleton.   IMPRESSION: 1. Hepatic steatosis.    Recent Labs: 11/12/2021: TSH 2.810  Recent Lipid Panel    Component Value Date/Time   CHOL 165 03/25/2021 0809   TRIG 127 03/25/2021 0809   HDL 37 (L) 03/25/2021 0809   CHOLHDL 4.5 (H) 03/25/2021 0809   CHOLHDL 6.2 (H) 11/30/2019 1036   VLDL 36 (H) 08/27/2015 1034   LDLCALC 105 (H) 03/25/2021 0809   LDLCALC 168 (H) 11/30/2019 1036    Physical Exam:    VS:  BP (!) 156/74   Pulse (!) 59   Ht '5\' 10"'$  (1.778 m)   Wt 240 lb 12.8 oz (109.2 kg)   SpO2 99%   BMI 34.55 kg/m     Wt Readings from Last 3 Encounters:  02/12/22 240 lb 12.8 oz (109.2 kg)  11/28/21 238 lb (108 kg)  11/27/20 228 lb (103.4 kg)     GEN: Well nourished, well developed in no acute distress HEENT: Normal NECK: No JVD; No carotid bruits LYMPHATICS: No lymphadenopathy CARDIAC: S1S2 noted,RRR, no murmurs, rubs, gallops RESPIRATORY:  Clear to auscultation without rales, wheezing or rhonchi  ABDOMEN: Soft, non-tender, non-distended, +bowel sounds, no guarding. EXTREMITIES: No edema, No cyanosis, no clubbing MUSCULOSKELETAL:  No deformity  SKIN: Warm and dry NEUROLOGIC:  Alert and oriented x 3, non-focal PSYCHIATRIC:  Normal affect, good insight  ASSESSMENT:    1.  Mixed hyperlipidemia   2. Hypertension, unspecified type     PLAN:    She is doing well from a cardiovascular standpoint.  We will refill her medication.  The patient understands the need to lose weight with diet and exercise. We have discussed specific strategies for this.  Blood pressure acceptable today.  The patient is in agreement with the above plan. The patient left the office in stable condition.  The patient will follow up in 1 year or sooner if needed.   Medication Adjustments/Labs and Tests Ordered: Current medicines are reviewed at length with the patient today.  Concerns regarding medicines are outlined above.  Orders Placed This Encounter  Procedures   Lipid panel   EKG 12-Lead    No orders of the defined types were placed in this encounter.    Patient Instructions  Medication Instructions:  Your physician recommends that you continue on your current medications as directed.  Please refer to the Current Medication list given to you today.  *If you need a refill on your cardiac medications before your next appointment, please call your pharmacy*   Lab Work: Your physician recommends that you have the following lab drawn today: Lipids  If you have labs (blood work) drawn today and your tests are completely normal, you will receive your results only by: MyChart Message (if you have MyChart) OR A paper copy in the mail If you have any lab test that is abnormal or we need to change your treatment, we will call you to review the results.   Testing/Procedures: NONE   Follow-Up: At [facility], you and your health needs are our priority.  As part of our continuing mission to provide you with exceptional heart care, we have created designated Provider Care Teams.  These Care Teams include your primary Cardiologist (physician) and Advanced Practice Providers (APPs -  Physician Assistants and Nurse Practitioners) who all work together to provide you with the  care you need, when you need it.  We recommend signing up for the patient portal called "MyChart".  Sign up information is provided on this After Visit Summary.  MyChart is used to connect with patients for Virtual Visits (Telemedicine).  Patients are able to view lab/test results, encounter notes, upcoming appointments, etc.  Non-urgent messages can be sent to your provider as well.   To learn more about what you can do with MyChart, go to [email or web address].    Your next appointment:   1 year(s)  The format for your next appointment:   In Person  Provider:   Berniece Salines, DO     Adopting a Healthy Lifestyle.  Know what a healthy weight is for you (roughly BMI <25) and aim to maintain this   Aim for 7+ servings of fruits and vegetables daily   65-80+ fluid ounces of water or unsweet tea for healthy kidneys   Limit to max 1 drink of alcohol per day; avoid smoking/tobacco   Limit animal fats in diet for cholesterol and heart health - choose grass fed whenever available   Avoid highly processed foods, and foods high in saturated/trans fats   Aim for low stress - take time to unwind and care for your mental health   Aim for 150 min of moderate intensity exercise weekly for heart health, and weights twice weekly for bone health   Aim for 7-9 hours of sleep daily   When it comes to diets, agreement about the perfect plan isnt easy to find, even among the experts. Experts at the [employer] developed an idea known as the Healthy Eating Plate. Just imagine a plate divided into logical, healthy portions.   The emphasis is on diet quality:   Load up on vegetables and fruits - one-half of your plate: Aim for color and variety, and remember that potatoes dont count.   Go for whole grains - one-quarter of your plate: Whole wheat, barley, wheat berries, quinoa, oats, brown rice, and foods made with them. If you want pasta, go with whole wheat pasta.   Protein  power - one-quarter of your plate: Fish, chicken, beans, and nuts are all healthy, versatile protein sources. Limit red meat.   The diet, however, does go beyond the plate, offering a few other suggestions.   Use healthy plant oils, such as olive, canola, soy, corn, sunflower and peanut. Check the labels, and avoid partially hydrogenated oil, which have unhealthy trans  fats.   If youre thirsty, drink water. Coffee and tea are good in moderation, but skip sugary drinks and limit milk and dairy products to one or two daily servings.   The type of carbohydrate in the diet is more important than the amount. Some sources of carbohydrates, such as vegetables, fruits, whole grains, and beans-are healthier than others.   Finally, stay active  Signed, Berniece Salines, DO  02/12/2022 8:29 AM    [facility]

## 2022-02-12 NOTE — Patient Instructions (Signed)
Medication Instructions:  Your physician recommends that you continue on your current medications as directed. Please refer to the Current Medication list given to you today.  *If you need a refill on your cardiac medications before your next appointment, please call your pharmacy*   Lab Work: Your physician recommends that you have the following lab drawn today: Lipids  If you have labs (blood work) drawn today and your tests are completely normal, you will receive your results only by: MyChart Message (if you have MyChart) OR A paper copy in the mail If you have any lab test that is abnormal or we need to change your treatment, we will call you to review the results.   Testing/Procedures: NONE   Follow-Up: At [facility], you and your health needs are our priority.  As part of our continuing mission to provide you with exceptional heart care, we have created designated Provider Care Teams.  These Care Teams include your primary Cardiologist (physician) and Advanced Practice Providers (APPs -  Physician Assistants and Nurse Practitioners) who all work together to provide you with the care you need, when you need it.  We recommend signing up for the patient portal called "MyChart".  Sign up information is provided on this After Visit Summary.  MyChart is used to connect with patients for Virtual Visits (Telemedicine).  Patients are able to view lab/test results, encounter notes, upcoming appointments, etc.  Non-urgent messages can be sent to your provider as well.   To learn more about what you can do with MyChart, go to [email or web address].    Your next appointment:   1 year(s)  The format for your next appointment:   In Person  Provider:   Berniece Salines, DO

## 2022-02-14 LAB — LIPID PANEL
Chol/HDL Ratio: 4.2 ratio (ref 0.0–4.4)
Cholesterol, Total: 146 mg/dL (ref 100–199)
HDL: 35 mg/dL — ABNORMAL LOW (ref >39)
LDL Chol Calc (NIH): 82 mg/dL (ref 0–99)
Triglycerides: 166 mg/dL — ABNORMAL HIGH (ref 0–149)
VLDL Cholesterol Cal: 29 mg/dL (ref 5–40)

## 2022-02-17 ENCOUNTER — Encounter: Payer: Self-pay | Admitting: Cardiology

## 2022-04-13 IMAGING — CT CT HEART MORP W/ CTA COR W/ SCORE W/ CA W/CM &/OR W/O CM
4 of 7 series · 8 of 20 positions shown, 9 images · IV contrast (omnipaque)
Comparison: None.
COMPARISON: None.

Addendum:
EXAM:
OVER-READ INTERPRETATION  CT CHEST

The following report is an over-read performed by radiologist Dr.
Xjessica Yong [REDACTED] on 09/20/2020. This
over-read does not include interpretation of cardiac or coronary
anatomy or pathology. The coronary calcium score/coronary CTA
interpretation by the cardiologist is attached.
HISTORY: Chest pain, nonspecific
Cardiac/Coronary CT
TECHNIQUE: The patient was scanned on a Siemens Force scanner.
PROTOCOL: A 100 kV prospective scan was triggered in the descending thoracic
aorta at 111 HU's. Axial non-contrast 3 mm slices were carried out
through the heart. The data set was analyzed on a dedicated work
station and scored using the Agatson method. Gantry rotation speed
was 250 msecs and collimation was 0.6 mm. Heart rate optimized
medically, and 0.8 mg of sublingual nitroglycerin was given. The 3D
data set was reconstructed in 5% intervals of 35-75% of the R-R
cycle. Diastolic phases were analyzed on a dedicated work station
using MPR, MIP and VRT modes. The patient received 95mL OMNIPAQUE
IOHEXOL 350 MG/ML SOLN of contrast.

[Series 6: best diast 73 % · axial · 0.39mm/px · z∈[-171,-128]mm · 2 of 321 slices shown]
[im 107/321  vessel]
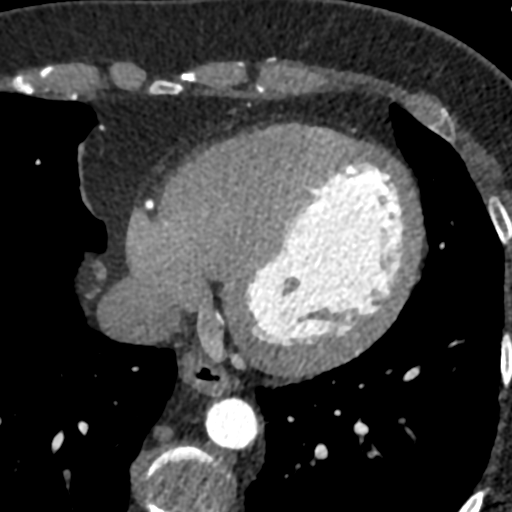
[im 214/321  vessel]
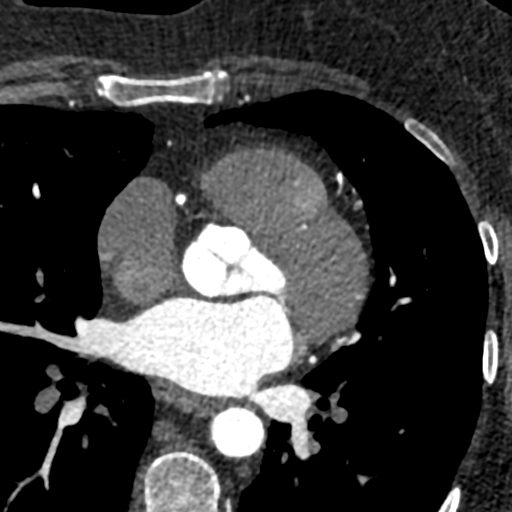

[Series 7: best syst · axial · 0.39mm/px · z∈[-171,-128]mm · 2 of 321 slices shown, 3 images]
[im 107/321  vessel]
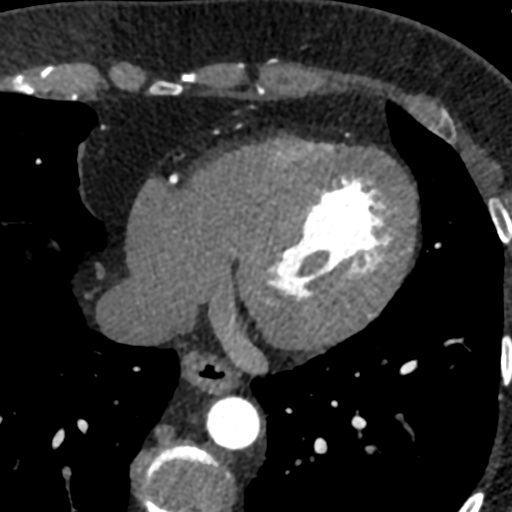
[im 107/321  lung]
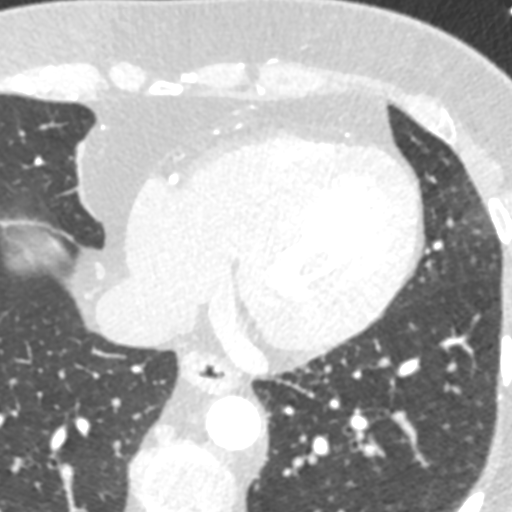
[im 214/321  vessel]
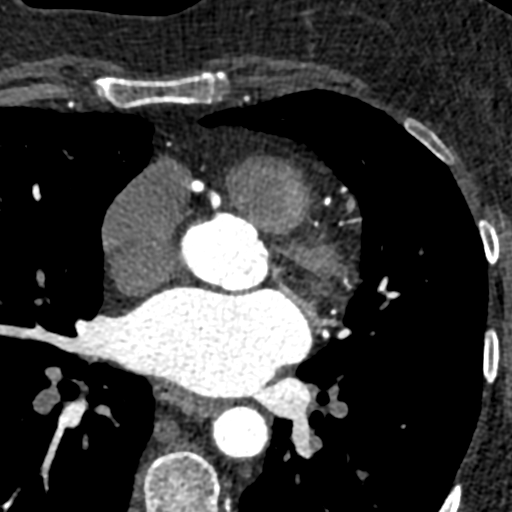

[Series 9: ts diast sharp 73 % · axial · 0.39mm/px · z∈[-171,-128]mm · 2 of 321 slices shown]
[im 107/321  lung]
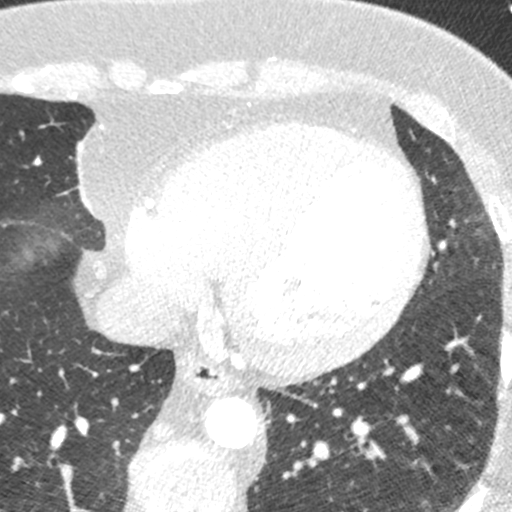
[im 214/321  lung]
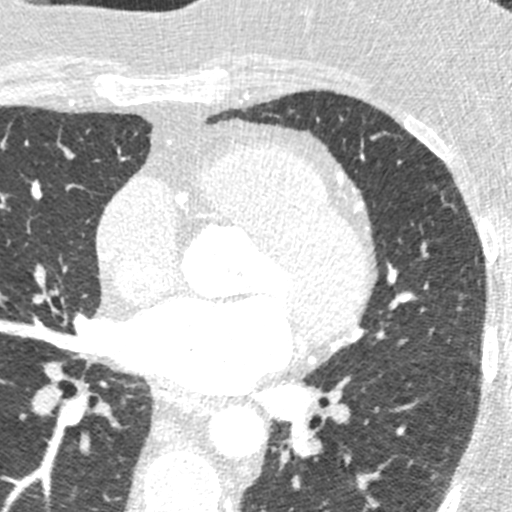

[Series 10: ts syst sharp · axial · 0.39mm/px · z∈[-171,-128]mm · 2 of 321 slices shown]
[im 107/321  lung]
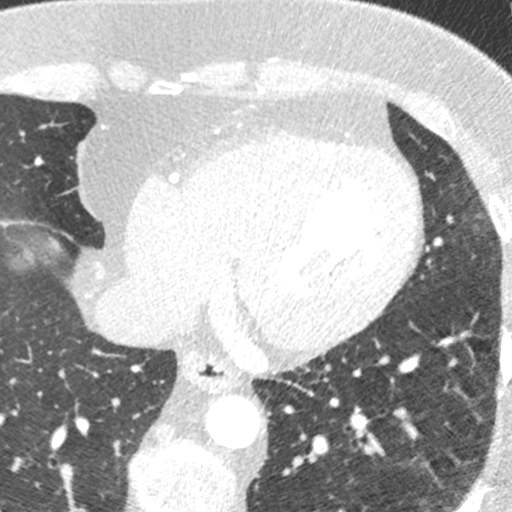
[im 214/321  lung]
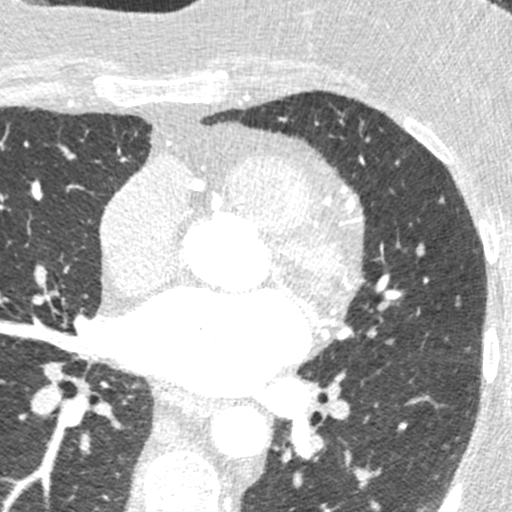

[8 of 20 positions shown; findings below may reference images not displayed]

FINDINGS: Within the visualized portions of the thorax there are no suspicious
appearing pulmonary nodules or masses, there is no acute
consolidative airspace disease, no pleural effusions, no
pneumothorax and no lymphadenopathy. Visualized portions of the
upper abdomen demonstrates diffuse low attenuation throughout the
visualized hepatic parenchyma. There are no aggressive appearing
lytic or blastic lesions noted in the visualized portions of the
skeleton.
IMPRESSION: 1. Hepatic steatosis.
FINDINGS: Coronary calcium score: The patient's coronary artery calcium score
is 0, which places the patient in the 0 percentile.

Coronary arteries: Normal coronary origins.  Right dominance.

Right Coronary Artery: Normal caliber vessel, gives rise to PDA. No
significant plaque or stenosis.

Left Main Coronary Artery: Normal caliber vessel. No significant
plaque or stenosis.

Left Anterior Descending Coronary Artery: Normal caliber vessel. No
significant plaque or stenosis. Gives rise to 2 diagonal branches.

Left Circumflex Artery: Normal caliber vessel. No significant plaque
or stenosis. Gives rise to 3 OM branches.

Aorta: Normal size, 29 mm at the mid ascending aorta (level of the
PA bifurcation) measured double oblique. No calcifications. No
dissection.

Aortic Valve: No calcifications. Trileaflet.

Other findings:

Normal pulmonary vein drainage into the left atrium. Common left
pulmonary vein trunk, normal variant.

Normal left atrial appendage without a thrombus.

Normal size of the pulmonary artery.
IMPRESSION: 1. No evidence of CAD, CADRADS = 0.

2. Coronary calcium score of 0. This was 0 percentile for age and
sex matched control.

3. Normal coronary origin with right dominance.

INTERPRETATION:

1. CAD-RADS 0: No evidence of CAD (0%). Consider non-atherosclerotic
causes of chest pain.

2. CAD-RADS 1: Minimal non-obstructive CAD (0-24%). Consider
non-atherosclerotic causes of chest pain. Consider preventive
therapy and risk factor modification.

3. CAD-RADS 2: Mild non-obstructive CAD (25-49%). Consider
non-atherosclerotic causes of chest pain. Consider preventive
therapy and risk factor modification.

4. CAD-RADS 3: Moderate stenosis (50-69%). Consider symptom-guided
anti-ischemic pharmacotherapy as well as risk factor modification
per guideline directed care. Additional analysis with CT FFR will be
submitted.

5. CAD-RADS 4: Severe stenosis. (70-99% or > 50% left main). Cardiac
catheterization or CT FFR is recommended. Consider symptom-guided
anti-ischemic pharmacotherapy as well as risk factor modification
per guideline directed care. Invasive coronary angiography
recommended with revascularization per published guideline
statements.

6. CAD-RADS 5: Total coronary occlusion (100%). Consider cardiac
catheterization or viability assessment. Consider symptom-guided
anti-ischemic pharmacotherapy as well as risk factor modification
per guideline directed care.

7. CAD-RADS N: Non-diagnostic study. Obstructive CAD can't be
excluded. Alternative evaluation is recommended.

*** End of Addendum ***
EXAM:
OVER-READ INTERPRETATION  CT CHEST

The following report is an over-read performed by radiologist Dr.
Xjessica Yong [REDACTED] on 09/20/2020. This
over-read does not include interpretation of cardiac or coronary
anatomy or pathology. The coronary calcium score/coronary CTA
interpretation by the cardiologist is attached.
FINDINGS: Within the visualized portions of the thorax there are no suspicious
appearing pulmonary nodules or masses, there is no acute
consolidative airspace disease, no pleural effusions, no
pneumothorax and no lymphadenopathy. Visualized portions of the
upper abdomen demonstrates diffuse low attenuation throughout the
visualized hepatic parenchyma. There are no aggressive appearing
lytic or blastic lesions noted in the visualized portions of the
skeleton.
IMPRESSION: 1. Hepatic steatosis.

## 2022-05-05 ENCOUNTER — Other Ambulatory Visit: Payer: Self-pay | Admitting: Physician Assistant

## 2022-05-05 DIAGNOSIS — E559 Vitamin D deficiency, unspecified: Secondary | ICD-10-CM

## 2022-06-02 ENCOUNTER — Other Ambulatory Visit: Payer: Self-pay | Admitting: Physician Assistant

## 2022-06-02 DIAGNOSIS — E063 Autoimmune thyroiditis: Secondary | ICD-10-CM

## 2022-09-23 ENCOUNTER — Other Ambulatory Visit: Payer: Self-pay | Admitting: Physician Assistant

## 2022-09-23 DIAGNOSIS — E063 Autoimmune thyroiditis: Secondary | ICD-10-CM

## 2022-10-20 ENCOUNTER — Telehealth: Payer: Self-pay | Admitting: Physician Assistant

## 2022-10-20 DIAGNOSIS — E063 Autoimmune thyroiditis: Secondary | ICD-10-CM

## 2022-10-20 MED ORDER — LEVOTHYROXINE SODIUM 137 MCG PO TABS: 137.0000 ug | ORAL_TABLET | Freq: Every day | ORAL | 0 refills | Status: DC

## 2022-10-20 NOTE — Telephone Encounter (Signed)
Patient called requesting a refill of levothyroxine (SYNTHROID) 137 MCG tablet [[id number]]   Patient requested only a month supply to last until her appointment on 12/04/22 incase her levels have changed.  Pharmacy ;  [id number] - [place], [place] - [address]  [address], [place] [address]

## 2022-10-20 NOTE — Telephone Encounter (Signed)
Sent Rx to pharmacy  

## 2022-11-14 ENCOUNTER — Other Ambulatory Visit: Payer: Self-pay | Admitting: Physician Assistant

## 2022-11-14 DIAGNOSIS — E063 Autoimmune thyroiditis: Secondary | ICD-10-CM

## 2022-12-02 ENCOUNTER — Encounter: Payer: Self-pay | Admitting: Physician Assistant

## 2022-12-03 ENCOUNTER — Other Ambulatory Visit: Payer: Self-pay | Admitting: Physician Assistant

## 2022-12-03 DIAGNOSIS — Z789 Other specified health status: Secondary | ICD-10-CM | POA: Diagnosis not present

## 2022-12-04 ENCOUNTER — Ambulatory Visit (INDEPENDENT_AMBULATORY_CARE_PROVIDER_SITE_OTHER): Payer: BC Managed Care – PPO | Admitting: Physician Assistant

## 2022-12-04 ENCOUNTER — Encounter: Payer: Self-pay | Admitting: Physician Assistant

## 2022-12-04 VITALS — BP 130/80 | HR 75 | Resp 18 | Ht 70.0 in | Wt 233.8 lb

## 2022-12-04 DIAGNOSIS — Z0000 Encounter for general adult medical examination without abnormal findings: Secondary | ICD-10-CM

## 2022-12-04 DIAGNOSIS — M7061 Trochanteric bursitis, right hip: Secondary | ICD-10-CM | POA: Diagnosis not present

## 2022-12-04 DIAGNOSIS — E063 Autoimmune thyroiditis: Secondary | ICD-10-CM

## 2022-12-04 DIAGNOSIS — M7062 Trochanteric bursitis, left hip: Secondary | ICD-10-CM

## 2022-12-04 DIAGNOSIS — Z780 Asymptomatic menopausal state: Secondary | ICD-10-CM

## 2022-12-04 DIAGNOSIS — E559 Vitamin D deficiency, unspecified: Secondary | ICD-10-CM

## 2022-12-04 DIAGNOSIS — L509 Urticaria, unspecified: Secondary | ICD-10-CM

## 2022-12-04 DIAGNOSIS — E038 Other specified hypothyroidism: Secondary | ICD-10-CM

## 2022-12-04 DIAGNOSIS — E785 Hyperlipidemia, unspecified: Secondary | ICD-10-CM

## 2022-12-04 LAB — CBC WITH DIFFERENTIAL/PLATELET
Basophils Absolute: 0.1 10*3/uL (ref 0.0–0.2)
Basos: 1 %
EOS (ABSOLUTE): 0.2 10*3/uL (ref 0.0–0.4)
Eos: 2 %
Hematocrit: 39.7 % (ref 34.0–46.6)
Hemoglobin: 13.1 g/dL (ref 11.1–15.9)
Immature Grans (Abs): 0 10*3/uL (ref 0.0–0.1)
Immature Granulocytes: 0 %
Lymphocytes Absolute: 3.5 10*3/uL — ABNORMAL HIGH (ref 0.7–3.1)
Lymphs: 40 %
MCH: 28.7 pg (ref 26.6–33.0)
MCHC: 33 g/dL (ref 31.5–35.7)
MCV: 87 fL (ref 79–97)
Monocytes Absolute: 0.9 10*3/uL (ref 0.1–0.9)
Monocytes: 11 %
Neutrophils Absolute: 4.1 10*3/uL (ref 1.4–7.0)
Neutrophils: 46 %
Platelets: 357 10*3/uL (ref 150–450)
RBC: 4.56 x10E6/uL (ref 3.77–5.28)
RDW: 12.8 % (ref 11.7–15.4)
WBC: 8.9 10*3/uL (ref 3.4–10.8)

## 2022-12-04 LAB — CMP14+EGFR
ALT: 38 IU/L — ABNORMAL HIGH (ref 0–32)
AST: 37 IU/L (ref 0–40)
Albumin: 4.3 g/dL (ref 3.8–4.9)
Alkaline Phosphatase: 87 IU/L (ref 44–121)
BUN/Creatinine Ratio: 17 (ref 9–23)
BUN: 14 mg/dL (ref 6–24)
Bilirubin Total: 0.4 mg/dL (ref 0.0–1.2)
CO2: 23 mmol/L (ref 20–29)
Calcium: 9.7 mg/dL (ref 8.7–10.2)
Chloride: 102 mmol/L (ref 96–106)
Creatinine, Ser: 0.82 mg/dL (ref 0.57–1.00)
Globulin, Total: 3.1 g/dL (ref 1.5–4.5)
Glucose: 84 mg/dL (ref 70–99)
Potassium: 5.2 mmol/L (ref 3.5–5.2)
Sodium: 139 mmol/L (ref 134–144)
Total Protein: 7.4 g/dL (ref 6.0–8.5)
eGFR: 83 mL/min/{1.73_m2} (ref >59)

## 2022-12-04 LAB — LIPID PANEL
Chol/HDL Ratio: 4.9 ratio — ABNORMAL HIGH (ref 0.0–4.4)
Cholesterol, Total: 162 mg/dL (ref 100–199)
HDL: 33 mg/dL — ABNORMAL LOW (ref >39)
LDL Chol Calc (NIH): 99 mg/dL (ref 0–99)
Triglycerides: 171 mg/dL — ABNORMAL HIGH (ref 0–149)
VLDL Cholesterol Cal: 30 mg/dL (ref 5–40)

## 2022-12-04 LAB — TSH: TSH: 1.05 u[IU]/mL (ref 0.450–4.500)

## 2022-12-04 MED ORDER — METHYLPREDNISOLONE SODIUM SUCC 125 MG IJ SOLR
125.0000 mg | Freq: Once | INTRAMUSCULAR | Status: AC
Start: 2022-12-04 — End: 2022-12-04
  Administered 2022-12-04: 125 mg via INTRAMUSCULAR

## 2022-12-04 MED ORDER — LIOTHYRONINE SODIUM 5 MCG PO TABS
5.0000 ug | ORAL_TABLET | Freq: Every day | ORAL | 1 refills | Status: DC
Start: 2022-12-04 — End: 2023-06-04

## 2022-12-04 MED ORDER — NEXLIZET 180-10 MG PO TABS
1.0000 | ORAL_TABLET | Freq: Every day | ORAL | 3 refills | Status: DC
Start: 2022-12-04 — End: 2023-12-07

## 2022-12-04 MED ORDER — EPINEPHRINE 0.3 MG/0.3ML IJ SOAJ: 0.3000 mg | Freq: Once | INTRAMUSCULAR | 0 refills | Status: AC

## 2022-12-04 NOTE — Progress Notes (Signed)
Complete physical exam  Patient: Katrina Clements   DOB: [birth year]   57 y.o. Female  MRN: [id number]  Subjective:    Chief Complaint  Patient presents with   Annual Exam    Katrina Clements is a 57 y.o. female who presents today for a complete physical exam. She reports consuming a general and she does avoid gluten and beef  diet.  She walks some.   She generally feels poorly. She reports sleeping fairly well. She does have additional problems to discuss today.   She is having bilateral hip pain. Worse in right than left. Ongoing for months but seems to be getting worse after coming back from [employer] and walking. Walking makes worse. NSAIDs help a lot. No injury. She is concerned it could be due to menopause and no estrogen. She was on estrogen for a few years after her hysterectomy but then went off.   She broke out in hives on trunk and arms after eating keylime flavored donut. Denies any mouth, tongue swelling. No difficulty breathing. Taking benadryl which helps some. This happened Wednesday night at 11pm, 2 days ago. Never happened before.    Most recent fall risk assessment:    11/28/2021    8:15 AM  Fall Risk   Falls in the past year? 1  Number falls in past yr: 1  Injury with Fall? 1  Risk for fall due to : No Fall Risks  Follow up Falls evaluation completed     Most recent depression screenings:    12/04/2022    8:31 AM 11/28/2021    8:15 AM  PHQ 2/9 Scores  PHQ - 2 Score 1 0  PHQ- 9 Score 2     Vision:Within last year and Dental: No current dental problems and Receives regular dental care  Patient Active Problem List   Diagnosis Date Noted   Trochanteric bursitis of both hips 12/04/2022   Hives 12/04/2022   Adenomyosis 10/24/2020   Allergy 10/24/2020   Endometriosis 10/24/2020   Thyroid disease 10/24/2020   Abnormal stress ECG 08/05/2020   Hypertension 08/05/2020   Obesity (BMI 30-39.9) 08/05/2020   Chest pain 06/05/2020   Family history of heart attack  06/05/2020   Arthralgia 12/08/2019   Class 1 obesity due to excess calories without serious comorbidity with body mass index (BMI) of 34.0 to 34.9 in adult 12/08/2019   Bilateral carpal tunnel syndrome 11/28/2018   Vitamin D deficiency 10/10/2018   Hypocalcemia 10/10/2018   Numbness and tingling in right hand 10/10/2018   Cervical herniated disc 04/06/2018   Dyslipidemia (high LDL; low HDL) 08/27/2016   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/01/2016   Hypothyroidism due to Hashimoto's thyroiditis 12/08/2013   Hyperlipidemia 12/08/2013   Fibromyalgia 12/08/2013   Past Medical History:  Diagnosis Date   Adenomyosis    Allergy    seasonal   Endometriosis    Hyperlipidemia    Thyroid disease    Past Surgical History:  Procedure Laterality Date   BLADDER SUSPENSION     btl     LAPAROSCOPY     x 2   VAGINAL HYSTERECTOMY     total   Family History  Problem Relation Age of Onset   Heart attack Mother    Rheum arthritis Mother    Diabetes Father    Cancer Father        prostate   Heart attack Maternal Grandmother    Stroke Maternal Grandfather    Heart attack Brother    Hypertension  Brother    Diabetes Sister    Allergies  Allergen Reactions   Augmentin [Amoxicillin-Pot Clavulanate]     Hives-regular amoxicillin is tolerated   Azithromycin     Yeast infection   Crestor [Rosuvastatin] Other (See Comments)    Muscle pain   Cymbalta [Duloxetine Hcl]     nausea   Gabapentin     Reflux   Lipitor [Atorvastatin] Other (See Comments)    Muscle pain   Lyrica [Pregabalin]     Reflux   Keflex [Cephalexin] Rash      Patient Care Team: Nolene Ebbs as PCP - General (Family Medicine) Thomasene Ripple, DO as PCP - Cardiology (Cardiology)   Outpatient Medications Prior to Visit  Medication Sig   Bempedoic Acid-Ezetimibe (NEXLIZET) 180-10 MG TABS Take 180 mg by mouth daily.   fluticasone (FLONASE) 50 MCG/ACT nasal spray Place 1 spray into both nostrils as needed for allergies or  rhinitis.   ketotifen (ZADITOR) 0.025 % ophthalmic solution 1 drop 2 (two) times daily.   levothyroxine (SYNTHROID) 137 MCG tablet TAKE 1 TABLET BY MOUTH DAILY BEFORE BREAKFAST.   liothyronine (CYTOMEL) 5 MCG tablet TAKE 1 TABLET BY MOUTH EVERY DAY   Vitamin D, Ergocalciferol, (DRISDOL) 1.25 MG (50000 UNIT) CAPS capsule TAKE 1 CAPSULE EVERY 7 DAYS   No facility-administered medications prior to visit.    ROS   See HPI.      Objective:     BP 130/80   Pulse 75   Resp 18   Ht 5\' 10"  (1.778 m)   Wt 233 lb 12 oz (106 kg)   SpO2 98%   BMI 33.54 kg/m  BP Readings from Last 3 Encounters:  12/04/22 130/80  02/12/22 (!) 142/80  11/28/21 104/82   Wt Readings from Last 3 Encounters:  12/04/22 233 lb 12 oz (106 kg)  02/12/22 240 lb 12.8 oz (109.2 kg)  11/28/21 238 lb (108 kg)    Procedure Note  Pre-operative Diagnosis: bilateral hip bursitis  Post-operative Diagnosis: same  Indications: pain  Procedure Details   Verbal consent was obtained for the procedure. The right lateral hip was prepped with alcohol. A 22 gauge needle was inserted into the right greater trochanter and 9 ml 1% lidocaine and 1 ml of triamcinolone (KENALOG) 40mg /ml was then injected.  The needle was removed and the area cleansed and dressed.  Complications:  None; patient tolerated the procedure well.  Physical Exam  BP 130/80   Pulse 75   Resp 18   Ht 5\' 10"  (1.778 m)   Wt 233 lb 12 oz (106 kg)   SpO2 98%   BMI 33.54 kg/m   General Appearance:    Alert, cooperative, no distress, appears stated age  Head:    Normocephalic, without obvious abnormality, atraumatic  Eyes:    PERRL, conjunctiva/corneas clear, EOM's intact, fundi    benign, both eyes  Ears:    Normal TM's and external ear canals, both ears  Nose:   Nares normal, septum midline, mucosa normal, no drainage    or sinus tenderness  Throat:   Lips, mucosa, and tongue normal; teeth and gums normal  Neck:   Supple, symmetrical, trachea  midline, no adenopathy;    thyroid:  no enlargement/tenderness/nodules; no carotid   bruit or JVD  Back:     Symmetric, no curvature, ROM normal, no CVA tenderness  Lungs:     Clear to auscultation bilaterally, respirations unlabored  Chest Wall:    No tenderness or deformity  Heart:    Regular rate and rhythm, S1 and S2 normal, no murmur, rub   or gallop     Abdomen:     Soft, non-tender, bowel sounds active all four quadrants,    no masses, no organomegaly        Extremities:   Extremities normal, atraumatic, no cyanosis or edema Bilateral tenderness over greater trochanter.   Pulses:   2+ and symmetric all extremities  Skin:   Whelps over trunk, arms, legs.   Lymph nodes:   Cervical, supraclavicular, and axillary nodes normal  Neurologic:   CNII-XII intact, normal strength, sensation and reflexes    throughout    Assessment & Plan:    Routine Health Maintenance and Physical Exam  Immunization History  Administered Date(s) Administered   Influenza,inj,Quad PF,6+ Mos 12/08/2013, 03/26/2014, 02/28/2016, 05/08/2018, 05/09/2020   Moderna Sars-Covid-2 Vaccination 07/24/2019, 08/21/2019   PFIZER(Purple Top)SARS-COV-2 Vaccination 04/14/2020   Tdap 05/12/2018   Zoster Recombinant(Shingrix) 11/27/2020, 11/28/2021    Health Maintenance  Topic Date Due   HIV Screening  Never done   Hepatitis C Screening  Never done   Fecal DNA (Cologuard)  05/11/2021   COVID-19 Vaccine (4 - 2023-24 season) 12/20/2022 (Originally 12/19/2021)   INFLUENZA VACCINE  07/19/2023 (Originally 11/19/2022)   MAMMOGRAM  01/02/2024   DTaP/Tdap/Td (2 - Td or Tdap) 05/12/2028   Zoster Vaccines- Shingrix  Completed   HPV VACCINES  Aged Out    Discussed health benefits of physical activity, and encouraged her to engage in regular exercise appropriate for her age and condition.  Marland KitchenLorina Rabon was seen today for annual exam.  Diagnoses and all orders for this visit:  Encounter for annual physical examination excluding  gynecological examination in a patient older than 17 years  Trochanteric bursitis of both hips  Hives -     EPINEPHrine 0.3 mg/0.3 mL IJ SOAJ injection; Inject 0.3 mg into the muscle once for 1 dose. -     methylPREDNISolone sodium succinate (SOLU-MEDROL) 125 mg/2 mL injection 125 mg  Post-menopausal -     Estradiol -     Progesterone -     Testosterone  Vitamin D deficiency -     VITAMIN D 25 Hydroxy (Vit-D Deficiency, Fractures)   .Marland Kitchen Discussed 150 minutes of exercise a week.  Encouraged vitamin D 1000 units and Calcium 1300mg  or 4 servings of dairy a day.  Reviewed other labs.  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

## 2022-12-04 NOTE — Patient Instructions (Addendum)
Hives Hives are itchy, red, swollen areas on your skin. They can show up on any part of your body. They often go away within 24 hours (acute hives). If you get new hives after the old ones fade and this goes on for many days or weeks, it is called chronic hives. Hives do not spread from person to person (are not contagious). Hives can happen when your body reacts to something that you are allergic to (allergen). These are sometimes called triggers. You can get hives right after being around a trigger, or hours later. What are the causes? Food allergies. Insect bites or stings. Allergies to pollen or pets. Spending time in sunlight, heat, or cold. Exercise. Stress. Other causes, such as: Viruses. This includes the common cold. Infections caused by germs (bacteria). Some medicines. Chemicals or latex. Allergy shots. Blood transfusions. In some cases, the cause is not known. What increases the risk? Being female. Being allergic to foods, such as: Citrus fruits. Milk. Eggs. Peanuts. Tree nuts. Shellfish. Being allergic to: Medicines. Latex. Insects. Animals. Pollen. What are the signs or symptoms?  Itchy, red or white bumps or spots on your skin. These areas may: Swell and get bigger. Change in shape and location. Stand alone or connect to each other over a large area of skin. Sting or hurt. Turn white when pressed in the center (blanch). In very bad cases, your hands, feet, and face may also swell. This may happen if hives start deeper in your skin. How is this treated? Treatment for hives depends on your symptoms. You may need to: Use cool, wet cloths (cool compresses) or take cool showers to stop the itching. Take or apply medicines to: Help with itching (antihistamines). Lessen swelling (corticosteroids). Treat infection (antibiotics). Have a medicine called omalizumab given to you as a shot. You may need this if your hives do not get better with other treatments. In  very bad cases, you may need to use a device filled with medicine that gives an emergency shot of epinephrine (auto-injector pen) to stop a very bad allergic reaction (anaphylactic reaction). Follow these instructions at home: Medicines Take or apply over-the-counter and prescription medicines only as told by your doctor. If you were prescribed antibiotics, use them as told by your doctor. Do not stop using them even if you start to feel better. Skin care Put cool, wet cloths on the hives. Do not scratch your skin. Do not rub your skin. General instructions Do not take hot showers or baths. This can make itching worse. Do not wear tight clothes. Use sunscreen. Wear clothes that cover your skin when you are outside. Avoid triggers that cause your hives. Keep a journal to help track what causes your hives. Write down: What medicines you take. What you eat and drink. What you put on your skin. Keep all follow-up visits. Your doctor will need to make sure treatment is working. Contact a doctor if: Your symptoms do not get better with medicine. Your joints hurt or swell. You have a fever. You have pain in your belly (abdomen). Get help right away if: Your tongue or lips swell. Your eyelids are swollen. Your chest or throat feels tight. You have trouble breathing or swallowing. These symptoms may be an emergency. Get help right away. Call 911. Do not wait to see if the symptoms will go away. Do not drive yourself to the hospital. This information is not intended to replace advice given to you by your health care provider. Make sure  you discuss any questions you have with your health care provider. Document Revised: 12/23/2021 Document Reviewed: 12/23/2021 Elsevier Patient Education  2024 Elsevier Inc.  Hip Bursitis  Hip bursitis is the swelling of one or more of the fluid-filled sacs (bursae) in the hip joint. The hip bursae absorb shocks and prevent bones from rubbing against each  other. If a bursa becomes irritated, it can fill with extra fluid and become inflamed. Hip bursitis can cause mild to moderate pain, and symptoms often come and go over time. What are the causes? This condition results from increased friction between the hip bones and the tendons around the hip joint. This condition can happen if you: Overuse your hip muscles. Injure your hip. Have weak buttocks muscles. Have bone spurs. Have an infection. In some cases, the cause may not be known. What increases the risk? You are more likely to develop this condition if: You injured your hip previously or had hip surgery. You have a medical condition, such as arthritis, gout, diabetes, or thyroid disease. You have spine problems. You have one leg that is shorter than the other. You participate in athletic activities that include repetitive motion, like running. You participate in sports where there is a risk of injury or falling, such as football, martial arts, or skiing. What are the signs or symptoms? Symptoms may come and go, and they often include: Pain in the hip or groin area. Pain may get worse with movement. Tenderness and swelling of the hip. In rare cases, the bursa may become infected. If this happens, you may get a fever, as well as warmth and redness in the hip area. How is this diagnosed? This condition may be diagnosed based on: Your symptoms. Your medical history. A physical exam. Imaging tests, such as: X-rays to check your bones. MRI or ultrasound to check your tendons and muscles. Bone scan. How is this treated? This condition is treated by resting, icing, applying pressure (compression), and raising (elevating) the injured area. This is called RICE treatment. In some cases, RICE treatment may not be enough to make your symptoms go away. Treatment may also include: Using crutches, a cane, or a walker to decrease the strain on your hip. Taking medicine to help with swelling and  pain. Getting a shot of cortisone medicine near the affected area to reduce swelling and pain. Taking antibiotic medicines if there is an infection. Draining fluid out of the bursa to help relieve swelling and pain. Having surgery to remove a damaged or infected bursa. This is rare. Long-term treatment may include: Physical therapy exercises for strength and flexibility. Identifying the cause of your bursitis to prevent future episodes. Lifestyle changes, such as weight loss, to reduce the strain on the hip. Follow these instructions at home: Managing pain, stiffness, and swelling     If directed, put ice on the affected area. To do this: Put ice in a plastic bag. Place a towel between your skin and the bag. Leave the ice on for 20 minutes, 2-3 times a day. Remove the ice if your skin turns bright red. This is very important. If you cannot feel pain, heat, or cold, you have a greater risk of damage to the area. Elevate your hip as much as you can without feeling pain. To do this, put a pillow under your hips while you lie down. If directed, apply heat to the affected area as often as told by your health care provider. Use the heat source that your health  care provider recommends, such as a moist heat pack or a heating pad. Place a towel between your skin and the heat source. Leave the heat on for 20-30 minutes. Remove the heat if your skin turns bright red. This is especially important if you are unable to feel pain, heat, or cold. You may have a greater risk of getting burned. Activity Do not use your hip to support your body weight until your health care provider says that you can. Use crutches, a cane, or a walker as told by your health care provider. If the affected leg is one that you use to drive, ask your health care provider if it is safe to drive. Rest and protect your hip as much as possible until your pain and swelling get better. Return to your normal activities as told by  your health care provider. Ask your health care provider what activities are safe for you. Do exercises as told by your health care provider. General instructions Take over-the-counter and prescription medicines only as told by your health care provider. Gently massage and stretch your injured area as often as is comfortable. Wear compression wraps only as told by your health care provider. If one of your legs is shorter than the other, get fitted for a shoe insert or orthotic. Your health care provider or physical therapist can tell you where to find these items and what size you need. Maintain a healthy weight. Follow instructions from your health care provider for weight control. These may include dietary restrictions. Keep all follow-up visits. This is important. How is this prevented? Exercise regularly or as told by your health care provider. Wear supportive footwear that is appropriate for your sport and daily activities. Warm up and stretch before being active. Cool down and stretch after being active. Take breaks regularly from repetitive activity. If an activity irritates your hip or causes pain, avoid the activity as much as possible. Avoid sitting down for long periods at a time. Where to find more information American Academy of [employer]: orthoinfo.aaos.org Contact a health care provider if: You have a fever. You develop new symptoms. You have trouble walking or doing everyday activities. You have pain that gets worse or does not get better with medicine. You develop red skin or a feeling of warmth in your hip area. Get help right away if: You cannot move your hip. You have severe pain. You cannot control the muscles in your feet. Summary Hip bursitis is the swelling of one or more of the fluid-filled sacs (bursae) in the hip joint. Hip bursitis can cause hip or groin pain, and symptoms often come and go over time. This condition is often treated by resting,  icing, applying pressure (compression), and raising (elevating) the injured area. Other treatments may be needed. This information is not intended to replace advice given to you by your health care provider. Make sure you discuss any questions you have with your health care provider. Document Revised: 04/01/2021 Document Reviewed: 04/01/2021 Elsevier Patient Education  2024 Elsevier Inc. Health Maintenance, Female Adopting a healthy lifestyle and getting preventive care are important in promoting health and wellness. Ask your health care provider about: The right schedule for you to have regular tests and exams. Things you can do on your own to prevent diseases and keep yourself healthy. What should I know about diet, weight, and exercise? Eat a healthy diet  Eat a diet that includes plenty of vegetables, fruits, low-fat dairy products, and lean protein. Do  not eat a lot of foods that are high in solid fats, added sugars, or sodium. Maintain a healthy weight Body mass index (BMI) is used to identify weight problems. It estimates body fat based on height and weight. Your health care provider can help determine your BMI and help you achieve or maintain a healthy weight. Get regular exercise Get regular exercise. This is one of the most important things you can do for your health. Most adults should: Exercise for at least 150 minutes each week. The exercise should increase your heart rate and make you sweat (moderate-intensity exercise). Do strengthening exercises at least twice a week. This is in addition to the moderate-intensity exercise. Spend less time sitting. Even light physical activity can be beneficial. Watch cholesterol and blood lipids Have your blood tested for lipids and cholesterol at 56 years of age, then have this test every 5 years. Have your cholesterol levels checked more often if: Your lipid or cholesterol levels are high. You are older than 57 years of age. You are at  high risk for heart disease. What should I know about cancer screening? Depending on your health history and family history, you may need to have cancer screening at various ages. This may include screening for: Breast cancer. Cervical cancer. Colorectal cancer. Skin cancer. Lung cancer. What should I know about heart disease, diabetes, and high blood pressure? Blood pressure and heart disease High blood pressure causes heart disease and increases the risk of stroke. This is more likely to develop in people who have high blood pressure readings or are overweight. Have your blood pressure checked: Every 3-5 years if you are 31-27 years of age. Every year if you are 73 years old or older. Diabetes Have regular diabetes screenings. This checks your fasting blood sugar level. Have the screening done: Once every three years after age 24 if you are at a normal weight and have a low risk for diabetes. More often and at a younger age if you are overweight or have a high risk for diabetes. What should I know about preventing infection? Hepatitis B If you have a higher risk for hepatitis B, you should be screened for this virus. Talk with your health care provider to find out if you are at risk for hepatitis B infection. Hepatitis C Testing is recommended for: Everyone born from [birth year] through 1965. Anyone with known risk factors for hepatitis C. Sexually transmitted infections (STIs) Get screened for STIs, including gonorrhea and chlamydia, if: You are sexually active and are younger than 57 years of age. You are older than 57 years of age and your health care provider tells you that you are at risk for this type of infection. Your sexual activity has changed since you were last screened, and you are at increased risk for chlamydia or gonorrhea. Ask your health care provider if you are at risk. Ask your health care provider about whether you are at high risk for HIV. Your health care provider may  recommend a prescription medicine to help prevent HIV infection. If you choose to take medicine to prevent HIV, you should first get tested for HIV. You should then be tested every 3 months for as long as you are taking the medicine. Pregnancy If you are about to stop having your period (premenopausal) and you may become pregnant, seek counseling before you get pregnant. Take 400 to 800 micrograms (mcg) of folic acid every day if you become pregnant. Ask for birth control (contraception) if you  want to prevent pregnancy. Osteoporosis and menopause Osteoporosis is a disease in which the bones lose minerals and strength with aging. This can result in bone fractures. If you are 27 years old or older, or if you are at risk for osteoporosis and fractures, ask your health care provider if you should: Be screened for bone loss. Take a calcium or vitamin D supplement to lower your risk of fractures. Be given hormone replacement therapy (HRT) to treat symptoms of menopause. Follow these instructions at home: Alcohol use Do not drink alcohol if: Your health care provider tells you not to drink. You are pregnant, may be pregnant, or are planning to become pregnant. If you drink alcohol: Limit how much you have to: 0-1 drink a day. Know how much alcohol is in your drink. In the [place] one drink equals one 12 oz bottle of beer (355 mL), one 5 oz glass of wine (148 mL), or one 1 oz glass of hard liquor (44 mL). Lifestyle Do not use any products that contain nicotine or tobacco. These products include cigarettes, chewing tobacco, and vaping devices, such as e-cigarettes. If you need help quitting, ask your health care provider. Do not use street drugs. Do not share needles. Ask your health care provider for help if you need support or information about quitting drugs. General instructions Schedule regular health, dental, and eye exams. Stay current with your vaccines. Tell your health care provider  if: You often feel depressed. You have ever been abused or do not feel safe at home. Summary Adopting a healthy lifestyle and getting preventive care are important in promoting health and wellness. Follow your health care provider's instructions about healthy diet, exercising, and getting tested or screened for diseases. Follow your health care provider's instructions on monitoring your cholesterol and blood pressure. This information is not intended to replace advice given to you by your health care provider. Make sure you discuss any questions you have with your health care provider. Document Revised: 08/26/2020 Document Reviewed: 08/26/2020 Elsevier Patient Education  2024 [employer].

## 2022-12-07 NOTE — Progress Notes (Signed)
Vitamin D looks good.  Estradiol and progesterone low as expected.

## 2022-12-15 ENCOUNTER — Encounter: Payer: Self-pay | Admitting: Physician Assistant

## 2022-12-28 LAB — PROGESTERONE: Progesterone: 0.1 ng/mL

## 2022-12-28 LAB — VITAMIN D 25 HYDROXY (VIT D DEFICIENCY, FRACTURES): Vit D, 25-Hydroxy: 49.9 ng/mL (ref 30.0–100.0)

## 2022-12-28 LAB — ESTRADIOL: Estradiol: 9.9 pg/mL

## 2022-12-28 LAB — SPECIMEN STATUS REPORT

## 2022-12-28 LAB — TESTOSTERONE: Testosterone: 17 ng/dL (ref 4–50)

## 2022-12-28 NOTE — Progress Notes (Signed)
Vitamin D: normal range

## 2023-03-17 ENCOUNTER — Telehealth: Payer: Self-pay

## 2023-03-17 NOTE — Telephone Encounter (Signed)
Initiated Prior authorization [id number]  tablets Via: Covermymeds Case/Key:[id number] Status: Pending as of 03/17/23 Reason: Notified Pt via: Mychart

## 2023-04-06 ENCOUNTER — Other Ambulatory Visit: Payer: Self-pay | Admitting: Physician Assistant

## 2023-04-06 DIAGNOSIS — E559 Vitamin D deficiency, unspecified: Secondary | ICD-10-CM

## 2023-05-29 ENCOUNTER — Other Ambulatory Visit: Payer: Self-pay | Admitting: Physician Assistant

## 2023-05-29 DIAGNOSIS — E063 Autoimmune thyroiditis: Secondary | ICD-10-CM

## 2023-06-02 ENCOUNTER — Encounter: Payer: Self-pay | Admitting: Physician Assistant

## 2023-06-02 MED ORDER — LEVOTHYROXINE SODIUM 137 MCG PO TABS
137.0000 ug | ORAL_TABLET | Freq: Every day | ORAL | 1 refills | Status: DC
Start: 2023-06-02 — End: 2023-12-06

## 2023-06-03 ENCOUNTER — Other Ambulatory Visit: Payer: Self-pay | Admitting: Physician Assistant

## 2023-06-03 DIAGNOSIS — E063 Autoimmune thyroiditis: Secondary | ICD-10-CM

## 2023-06-30 ENCOUNTER — Encounter: Payer: Self-pay | Admitting: Physician Assistant

## 2023-07-06 ENCOUNTER — Ambulatory Visit (INDEPENDENT_AMBULATORY_CARE_PROVIDER_SITE_OTHER): Admitting: Sports Medicine

## 2023-07-06 ENCOUNTER — Encounter: Payer: Self-pay | Admitting: Sports Medicine

## 2023-07-06 ENCOUNTER — Ambulatory Visit

## 2023-07-06 DIAGNOSIS — S83206A Unspecified tear of unspecified meniscus, current injury, right knee, initial encounter: Secondary | ICD-10-CM

## 2023-07-06 DIAGNOSIS — M1711 Unilateral primary osteoarthritis, right knee: Secondary | ICD-10-CM | POA: Insufficient documentation

## 2023-07-06 DIAGNOSIS — Z0189 Encounter for other specified special examinations: Secondary | ICD-10-CM | POA: Diagnosis not present

## 2023-07-06 DIAGNOSIS — M25461 Effusion, right knee: Secondary | ICD-10-CM | POA: Diagnosis not present

## 2023-07-06 DIAGNOSIS — M170 Bilateral primary osteoarthritis of knee: Secondary | ICD-10-CM | POA: Insufficient documentation

## 2023-07-06 DIAGNOSIS — M25462 Effusion, left knee: Secondary | ICD-10-CM | POA: Diagnosis not present

## 2023-07-06 NOTE — Progress Notes (Signed)
    Procedures performed today:    None.  Independent interpretation of notes and tests performed by another provider:   None.  Brief History, Exam, Impression, and Recommendations:    Right knee meniscal tear Very pleasant 58 year old female, she was doing well until she was crawling around her daughter's attic doing some repairs, unfortunately she then developed worsening pain lateral joint line with swelling, mechanical symptoms as well as popping, catching, locking, on exam she has a visible and palpable effusion, she has a positive McMurray's sign. Due to the mechanical symptoms we will proceed with x-rays and an MRI, she has meloxicam at home, return to see me to go over MRI results.    ____________________________________________ Ihor Austin. Benjamin Stain, M.D., ABFM., CAQSM., AME. Primary Care and Sports Medicine [facility]  Adjunct Professor of Family Medicine  [place] [employer]  Restaurant manager, fast food

## 2023-07-06 NOTE — Assessment & Plan Note (Signed)
 Very pleasant 58 year old female, she was doing well until she was crawling around her daughter's attic doing some repairs, unfortunately she then developed worsening pain lateral joint line with swelling, mechanical symptoms as well as popping, catching, locking, on exam she has a visible and palpable effusion, she has a positive McMurray's sign. Due to the mechanical symptoms we will proceed with x-rays and an MRI, she has meloxicam at home, return to see me to go over MRI results.

## 2023-07-17 ENCOUNTER — Ambulatory Visit

## 2023-07-17 DIAGNOSIS — M25461 Effusion, right knee: Secondary | ICD-10-CM | POA: Diagnosis not present

## 2023-07-17 DIAGNOSIS — M25561 Pain in right knee: Secondary | ICD-10-CM

## 2023-07-17 DIAGNOSIS — R609 Edema, unspecified: Secondary | ICD-10-CM | POA: Diagnosis not present

## 2023-07-17 DIAGNOSIS — S83206A Unspecified tear of unspecified meniscus, current injury, right knee, initial encounter: Secondary | ICD-10-CM

## 2023-07-17 DIAGNOSIS — M1711 Unilateral primary osteoarthritis, right knee: Secondary | ICD-10-CM | POA: Diagnosis not present

## 2023-08-03 ENCOUNTER — Encounter: Payer: Self-pay | Admitting: Sports Medicine

## 2023-08-03 ENCOUNTER — Other Ambulatory Visit (INDEPENDENT_AMBULATORY_CARE_PROVIDER_SITE_OTHER)

## 2023-08-03 ENCOUNTER — Ambulatory Visit (INDEPENDENT_AMBULATORY_CARE_PROVIDER_SITE_OTHER): Admitting: Sports Medicine

## 2023-08-03 DIAGNOSIS — S83206A Unspecified tear of unspecified meniscus, current injury, right knee, initial encounter: Secondary | ICD-10-CM

## 2023-08-03 DIAGNOSIS — M1711 Unilateral primary osteoarthritis, right knee: Secondary | ICD-10-CM | POA: Diagnosis not present

## 2023-08-03 MED ORDER — TRIAMCINOLONE ACETONIDE 40 MG/ML IJ SUSP
40.0000 mg | Freq: Once | INTRAMUSCULAR | Status: AC
  Administered 2023-08-03: 40 mg via INTRAMUSCULAR

## 2023-08-03 NOTE — Assessment & Plan Note (Signed)
 Pleasant 58 year old female, she was doing some work crawling around in her daughter's attic doing some repairs. 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 Pain is predominately patellofemoral today. Today we did a joint injection, adding formal physical therapy, return to see me in 6 weeks, we will consider viscosupplementation if not better. We also discussed geniculate artery embolization and arthroplasty today.

## 2023-08-03 NOTE — Progress Notes (Signed)
    Procedures performed today:    Procedure: Real-time Ultrasound Guided injection of the right knee Device: Samsung HS60  Verbal informed consent obtained.  Time-out conducted.  Noted no overlying erythema, induration, or other signs of local infection.  Skin prepped in a sterile fashion.  Local anesthesia: Topical Ethyl chloride.  With sterile technique and under real time ultrasound guidance: mild effusion noted, 1 cc Kenalog 40, 2 cc lidocaine, 2 cc bupivacaine injected easily Completed without difficulty  Advised to call if fevers/chills, erythema, induration, drainage, or persistent bleeding.  Images permanently stored and available for review in PACS.  Impression: Technically successful ultrasound guided injection.  Independent interpretation of notes and tests performed by another provider:   None.  Brief History, Exam, Impression, and Recommendations:    Primary osteoarthritis of right knee Pleasant 58 year old female, she was doing some work crawling around in her daughter's attic doing some repairs. 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 Pain is predominately patellofemoral today. Today we did a joint injection, adding formal physical therapy, return to see me in 6 weeks, we will consider viscosupplementation if not better. We also discussed geniculate artery embolization and arthroplasty today.    ____________________________________________ Joselyn Nicely. Sandy Crumb, M.D., ABFM., CAQSM., AME. Primary Care and Sports Medicine [facility]  Adjunct Professor of [facility] Medicine  [employer]  Stage manager

## 2023-08-25 ENCOUNTER — Ambulatory Visit: Attending: Sports Medicine | Admitting: Physical Therapy

## 2023-08-25 ENCOUNTER — Other Ambulatory Visit: Payer: Self-pay

## 2023-08-25 DIAGNOSIS — M25661 Stiffness of right knee, not elsewhere classified: Secondary | ICD-10-CM | POA: Insufficient documentation

## 2023-08-25 DIAGNOSIS — G8929 Other chronic pain: Secondary | ICD-10-CM | POA: Insufficient documentation

## 2023-08-25 DIAGNOSIS — M6281 Muscle weakness (generalized): Secondary | ICD-10-CM | POA: Insufficient documentation

## 2023-08-25 DIAGNOSIS — M1711 Unilateral primary osteoarthritis, right knee: Secondary | ICD-10-CM | POA: Insufficient documentation

## 2023-08-25 DIAGNOSIS — R2689 Other abnormalities of gait and mobility: Secondary | ICD-10-CM | POA: Diagnosis present

## 2023-08-25 DIAGNOSIS — M25551 Pain in right hip: Secondary | ICD-10-CM | POA: Insufficient documentation

## 2023-08-25 DIAGNOSIS — M25561 Pain in right knee: Secondary | ICD-10-CM | POA: Diagnosis present

## 2023-08-25 NOTE — Therapy (Signed)
 OUTPATIENT PHYSICAL THERAPY LOWER EXTREMITY EVALUATION   Patient Name: Katrina Clements MRN: [id number] DOB:02-Apr-1966, 58 y.o., female Today's Date: 08/25/2023  END OF SESSION:  PT End of Session - 08/25/23 1534     Visit Number 1    Authorization Type BCBS    PT Start Time 1534    PT Stop Time 1625    PT Time Calculation (min) 51 min             Past Medical History:  Diagnosis Date   Adenomyosis    Allergy    seasonal   Endometriosis    Hyperlipidemia    Thyroid  disease    Past Surgical History:  Procedure Laterality Date   BLADDER SUSPENSION     btl     LAPAROSCOPY     x 2   VAGINAL HYSTERECTOMY     total   Patient Active Problem List   Diagnosis Date Noted   Primary osteoarthritis of right knee 07/06/2023   Trochanteric bursitis of both hips 12/04/2022   Hives 12/04/2022   Adenomyosis 10/24/2020   Allergy 10/24/2020   Endometriosis 10/24/2020   Thyroid  disease 10/24/2020   Abnormal stress ECG 08/05/2020   Hypertension 08/05/2020   Obesity (BMI 30-39.9) 08/05/2020   Chest pain 06/05/2020   Family history of heart attack 06/05/2020   Arthralgia 12/08/2019   Class 1 obesity due to excess calories without serious comorbidity with body mass index (BMI) of 34.0 to 34.9 in adult 12/08/2019   Bilateral carpal tunnel syndrome 11/28/2018   Vitamin D  deficiency 10/10/2018   Hypocalcemia 10/10/2018   Numbness and tingling in right hand 10/10/2018   Cervical herniated disc 04/06/2018   Dyslipidemia (high LDL; low HDL) 08/27/2016   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/01/2016   Hypothyroidism due to Hashimoto's thyroiditis 12/08/2013   Hyperlipidemia 12/08/2013   Fibromyalgia 12/08/2013    PCP: Araceli Knight, PA-C  REFERRING PROVIDER: Gean Keels, MD  REFERRING DIAG: M17.11 (ICD-10-CM) - Primary osteoarthritis of right knee  THERAPY DIAG:  Muscle weakness (generalized)  Other abnormalities of gait and mobility  Chronic pain of right knee  Pain in  right hip  Stiffness of right knee, not elsewhere classified  Rationale for Evaluation and Treatment: Rehabilitation  ONSET DATE: ~3 weeks ago exacerbation, chronic pain in knee since Dec  SUBJECTIVE:   SUBJECTIVE STATEMENT: Pt states she had bursitis in her R knee (no longer an issue after the shot). Was given exercises and doing activities on her knees and got bad. Driving really exacerbated it. Has had a joint injection ~2 weeks ago which did help for ~1.5 weeks. Back to using meloxicam  and tylenol. Pt state she is rehabbing a 58 year old house.   PERTINENT HISTORY: N/a  PAIN:  Are you having pain? Yes: NPRS scale: 5 or 6 at rest, at worst >10 Pain location: R medial anterior knee (adductor insertion), lateral quad Pain description: throbs Aggravating factors: Walking, stairs, transfers Relieving factors: Propped and bent,   PRECAUTIONS: None  RED FLAGS: None   WEIGHT BEARING RESTRICTIONS: No  FALLS:  Has patient fallen in last 6 months? Yes. 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  DIAGNOSTIC FINDINGS: "MRI did show some degenerative meniscal fraying, dominant finding on my interpretation was significant chondromalacia and some areas near full-thickness under the patella. Pain is predominately patellofemoral today."  PATIENT SURVEYS:  LEFS: 39/80 = 48.7%  COGNITION: Overall cognitive status: Within functional limits for tasks  assessed     SENSATION: WFL  EDEMA:  Circumferential: 41 on L, 45 on R  MUSCLE LENGTH: Hamstrings: WNL  POSTURE: No Significant postural limitations  PALPATION: Highly tender to palpation R quad, adductors and ITB  LOWER EXTREMITY ROM:  Active ROM Right eval Left eval  Hip flexion    Hip extension    Hip abduction    Hip adduction    Hip internal rotation    Hip external rotation    Knee flexion 105 * 125  Knee extension -13* 0  Ankle dorsiflexion    Ankle plantarflexion    Ankle inversion    Ankle eversion     (Blank rows = not tested, * = pain)  LOWER EXTREMITY MMT:  MMT Right eval Left eval  Hip flexion 5 4+  Hip extension 3+ 5  Hip abduction 3+ 3+  Hip adduction    Hip internal rotation    Hip external rotation    Knee flexion 3+ * 5  Knee extension 5 5  Ankle dorsiflexion    Ankle plantarflexion    Ankle inversion    Ankle eversion     (Blank rows = not tested, * = pain)  LOWER EXTREMITY SPECIAL TESTS:  Hip special tests: [name] test: negative and Ober's test: positive   FUNCTIONAL TESTS: (deferred due to pain eval day) 5 times sit to stand: TBA SLS: TBA  GAIT: Distance walked: Into clinic Assistive device utilized: Crutches 1 crutch on R initially Level of assistance: Modified independence Comments: Antalgic, decreased knee extension and weightbearing on R. Cues to use crutch on L                                                                                                                                TREATMENT DATE: 08/25/23 See HEP below    PATIENT EDUCATION:  Education details: Exam findings, POC, initial HEP Person educated: Patient Education method: Explanation, Demonstration, and Handouts Education comprehension: verbalized understanding, returned demonstration, and needs further education  HOME EXERCISE PROGRAM: Access Code: [email or web address] URL: [email or web address] Date: 08/25/2023 Prepared by: [name]  [name]  Exercises - Modified Thomas Stretch  - 1 x daily - 7 x weekly - 2 sets - 1-2 min hold - Supine Quad Set  - 1 x daily - 7 x weekly - 2 sets - 10 reps - Hooklying Isometric Clamshell  - 1 x daily - 7 x weekly - 2 sets - 10 reps - Standing  Hip Extension with Leg Bent and Support  - 1 x daily - 7 x weekly - 2 sets - 10 reps  ASSESSMENT:  CLINICAL IMPRESSION: Patient is a 59 y.o. F who was seen today for physical therapy evaluation and treatment for R knee pain and edema. Imaging significant for R meniscus fraying. PMH of bilat hip bursitis. Assessment significant for increased R quad spasm/trigger points resulting in overall decreased R knee ROM, decreased patellar mobility, and pain along with weak bilat hips and taut/tender ITB. R knee is currently edematous compared to L and discussed compression to help address along with her continued ice and elevation. Pt will greatly benefit from PT to improve on these deficits for return to PLOF.   OBJECTIVE IMPAIRMENTS: Abnormal gait, decreased activity tolerance, decreased balance, decreased endurance, decreased mobility, difficulty walking, decreased ROM, decreased strength, increased edema, increased fascial restrictions, increased muscle spasms, improper body mechanics, postural dysfunction, and pain.   ACTIVITY LIMITATIONS: carrying, lifting, sitting, standing, squatting, sleeping, stairs, transfers, bed mobility, bathing, toileting, dressing, hygiene/grooming, locomotion level, and caring for others  PARTICIPATION LIMITATIONS: meal prep, cleaning, laundry, driving, shopping, community activity, and yard work  PERSONAL FACTORS: Age, Fitness, Past/current experiences, and Time since onset of injury/illness/exacerbation are also affecting patient's functional outcome.   REHAB POTENTIAL: Good  CLINICAL DECISION MAKING: Evolving/moderate complexity  EVALUATION COMPLEXITY: Moderate   GOALS: Goals reviewed with patient? Yes  SHORT  TERM GOALS: Target date: 09/22/2023  Pt will be ind with initial HEP Baseline: Goal status: INITIAL  2.  Pt will demo improved R knee ROM from 5-110 deg for amb Baseline:  Goal status: INITIAL   LONG TERM GOALS: Target date: 10/20/2023   Pt will be ind with management and progression of HEP Baseline:  Goal status: INITIAL  2.  Pt will have improved knee ROM from 0 to 120 deg for stair negotiation Baseline:  Goal status: INITIAL  3.  Pt will be able to ascend/descend stairs in normal reciprocal pattern for improved home mobility Baseline:  Goal status: INITIAL  4.  Pt will be able to walk independently x 1000' to access community Baseline:  Goal status: INITIAL  5.  Pt will demo 5 x STS of </=13 sec for decreased fall risk Baseline:  Goal status: INITIAL  6.  Pt will have improved LEFS to >/=48/80 to demo MCID Baseline:  Goal status: INITIAL   PLAN:  PT FREQUENCY: 1-2x/week  PT DURATION: 8 weeks  PLANNED INTERVENTIONS: [id number]- PT Re-evaluation, 97750- Physical Performance Testing, 97110-Therapeutic exercises, 97530- Therapeutic activity, [id number]- Neuromuscular re-education, 97535- Self Care, 40102- Manual therapy, [id number]- Gait training, [phone]- Aquatic Therapy, [phone]- Electrical stimulation (unattended), [phone]- Ionotophoresis 4mg /ml Dexamethasone, Patient/Family education, Balance training, Stair training, Taping, Dry Needling, Joint mobilization, Spinal mobilization, Cryotherapy, and Moist heat  PLAN FOR NEXT SESSION: Assess response to HEP -- modify/progress accordingly. Manual as needed for quad/hamstrings. Patellar mobs. Gentle knee ROM as tolerated. R hip and LE strengthening. Did she get compression?   Kanton Kamel April Ma L Cotton Beckley, PT 08/25/2023, 4:42 PM

## 2023-09-01 ENCOUNTER — Ambulatory Visit

## 2023-09-01 DIAGNOSIS — R2689 Other abnormalities of gait and mobility: Secondary | ICD-10-CM | POA: Diagnosis not present

## 2023-09-01 DIAGNOSIS — M6281 Muscle weakness (generalized): Secondary | ICD-10-CM

## 2023-09-01 DIAGNOSIS — M25551 Pain in right hip: Secondary | ICD-10-CM

## 2023-09-01 DIAGNOSIS — G8929 Other chronic pain: Secondary | ICD-10-CM | POA: Diagnosis not present

## 2023-09-01 DIAGNOSIS — M25661 Stiffness of right knee, not elsewhere classified: Secondary | ICD-10-CM

## 2023-09-01 DIAGNOSIS — M25561 Pain in right knee: Secondary | ICD-10-CM | POA: Diagnosis not present

## 2023-09-01 DIAGNOSIS — M1711 Unilateral primary osteoarthritis, right knee: Secondary | ICD-10-CM | POA: Diagnosis not present

## 2023-09-01 NOTE — Therapy (Signed)
 OUTPATIENT PHYSICAL THERAPY LOWER EXTREMITY TREATMENT   Patient Name: Katrina Clements MRN: [id number] DOB:1965-06-06, 58 y.o., female Today's Date: 09/01/2023  END OF SESSION:  PT End of Session - 09/01/23 1613     Visit Number 2    Number of Visits 8    Date for PT Re-Evaluation 10/20/23    Authorization Type BCBS    PT Start Time 1531    PT Stop Time 1612    PT Time Calculation (min) 41 min              Past Medical History:  Diagnosis Date   Adenomyosis    Allergy    seasonal   Endometriosis    Hyperlipidemia    Thyroid  disease    Past Surgical History:  Procedure Laterality Date   BLADDER SUSPENSION     btl     LAPAROSCOPY     x 2   VAGINAL HYSTERECTOMY     total   Patient Active Problem List   Diagnosis Date Noted   Primary osteoarthritis of right knee 07/06/2023   Trochanteric bursitis of both hips 12/04/2022   Hives 12/04/2022   Adenomyosis 10/24/2020   Allergy 10/24/2020   Endometriosis 10/24/2020   Thyroid  disease 10/24/2020   Abnormal stress ECG 08/05/2020   Hypertension 08/05/2020   Obesity (BMI 30-39.9) 08/05/2020   Chest pain 06/05/2020   Family history of heart attack 06/05/2020   Arthralgia 12/08/2019   Class 1 obesity due to excess calories without serious comorbidity with body mass index (BMI) of 34.0 to 34.9 in adult 12/08/2019   Bilateral carpal tunnel syndrome 11/28/2018   Vitamin D  deficiency 10/10/2018   Hypocalcemia 10/10/2018   Numbness and tingling in right hand 10/10/2018   Cervical herniated disc 04/06/2018   Dyslipidemia (high LDL; low HDL) 08/27/2016   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/01/2016   Hypothyroidism due to Hashimoto's thyroiditis 12/08/2013   Hyperlipidemia 12/08/2013   Fibromyalgia 12/08/2013    PCP: Araceli Knight, PA-C  REFERRING PROVIDER: Gean Keels, MD  REFERRING DIAG: M17.11 (ICD-10-CM) - Primary osteoarthritis of right knee  THERAPY DIAG:  Muscle weakness (generalized)  Other abnormalities  of gait and mobility  Chronic pain of right knee  Pain in right hip  Stiffness of right knee, not elsewhere classified  Rationale for Evaluation and Treatment: Rehabilitation  ONSET DATE: ~3 weeks ago exacerbation, chronic pain in knee since Dec  SUBJECTIVE:   SUBJECTIVE STATEMENT: Pt reports she has been doing most of HEP.   PERTINENT HISTORY: N/a  PAIN:  Are you having pain? Yes: NPRS scale: 5 or 6 at rest, at worst >10 Pain location: R medial anterior knee (adductor insertion), lateral quad Pain description: throbs Aggravating factors: Walking, stairs, transfers Relieving factors: Propped and bent,   PRECAUTIONS: None  RED FLAGS: None   WEIGHT BEARING RESTRICTIONS: No  FALLS:  Has patient fallen in last 6 months? Yes. 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  DIAGNOSTIC FINDINGS: "MRI did show some  degenerative meniscal fraying, dominant finding on my interpretation was significant chondromalacia and some areas near full-thickness under the patella. Pain is predominately patellofemoral today."  PATIENT SURVEYS:  LEFS: 39/80 = 48.7%  COGNITION: Overall cognitive status: Within functional limits for tasks assessed     SENSATION: WFL  EDEMA:  Circumferential: 41 on L, 45 on R  MUSCLE LENGTH: Hamstrings: WNL  POSTURE: No Significant postural limitations  PALPATION: Highly tender to palpation R quad, adductors and ITB  LOWER  EXTREMITY ROM:  Active ROM Right eval Left eval  Hip flexion    Hip extension    Hip abduction    Hip adduction    Hip internal rotation    Hip external rotation    Knee flexion 105 * 125  Knee extension -13* 0  Ankle dorsiflexion    Ankle plantarflexion    Ankle inversion    Ankle eversion     (Blank rows = not tested, * = pain)  LOWER EXTREMITY MMT:  MMT Right eval Left eval  Hip flexion 5 4+  Hip extension 3+ 5  Hip abduction 3+ 3+  Hip adduction    Hip internal rotation    Hip external rotation    Knee flexion 3+ * 5  Knee extension 5 5  Ankle dorsiflexion    Ankle plantarflexion    Ankle inversion    Ankle eversion     (Blank rows = not tested, * = pain)  LOWER EXTREMITY SPECIAL TESTS:  Hip special tests: Katrina Bannister test: negative and Ober's test: positive   FUNCTIONAL TESTS: (deferred due to pain eval day) 5 times sit to stand: TBA SLS: TBA  GAIT: Distance walked: Into clinic Assistive device utilized: Crutches 1 crutch on R initially Level of assistance: Modified independence Comments: Antalgic, decreased knee extension and weightbearing on R. Cues to use crutch on L                                                                                                                                TREATMENT DATE:  09/01/23 Bike pain Nustep L3x31min  Seated tibia on femur PA and AP mobs grade II for pain Patellar glides medial  IASTM to R ITB and VL closer to knee  Supine ITB stretch with strap 3x15"  Supine hamstring stretch with strap 2x30"  08/25/23 See HEP below    PATIENT EDUCATION:  Education details: HEP updates Person educated: Patient Education method: Explanation, Demonstration, and Handouts Education comprehension: verbalized understanding, returned demonstration, and needs further education  HOME EXERCISE PROGRAM: Access Code: [email or web address] URL: [email or web address] Date: 09/01/2023 Prepared by: Kymir Coles  Exercises -  Modified Thomas Stretch  - 1 x daily - 7 x weekly - 2 sets - 1-2 min hold - Supine Quad Set  - 1 x daily - 7 x weekly - 2 sets - 10 reps - Hooklying Isometric Clamshell  - 1 x daily - 7 x weekly - 2 sets -  10 reps - Standing Hip Extension with Leg Bent and Support  - 1 x daily - 7 x weekly - 2 sets - 10 reps - Supine Hamstring Stretch with Strap  - 1 x daily - 7 x weekly - 2 sets - 2 reps - 30 sec hold - Supine ITB Stretch with Strap  - 1 x daily - 7 x weekly - 2 sets - 2 reps - 15-30 sec hold  ASSESSMENT:  CLINICAL IMPRESSION: Pt arrived with a lot of R knee pain, unable to do recumbent bike d/t pain. I felt some increased ROM after the joint mobilizations and pt reported overall improved mobility after the session. She was tight and tender to touch along the R ITB and VL closer to the knee. Decreased R medial patellar glide as well with patellar mobs. Will continue to address pain and ROM restrictions as needed.   IE: Patient is a 58 y.o. F who was seen today for physical therapy evaluation and treatment for R knee pain and edema. Imaging significant for R meniscus fraying. PMH of bilat hip bursitis. Assessment significant for increased R quad spasm/trigger points resulting in overall decreased R knee ROM, decreased patellar mobility, and pain along with weak bilat hips and taut/tender ITB. R knee is currently edematous compared to L and discussed compression to help address along with her continued ice and elevation. Pt will greatly benefit from PT to improve on these deficits for return to PLOF.   OBJECTIVE IMPAIRMENTS: Abnormal gait, decreased activity tolerance, decreased balance, decreased endurance, decreased mobility, difficulty walking, decreased ROM, decreased strength, increased edema, increased fascial restrictions, increased muscle spasms, improper body mechanics, postural dysfunction, and pain.   ACTIVITY LIMITATIONS: carrying, lifting, sitting, standing, squatting, sleeping, stairs,  transfers, bed mobility, bathing, toileting, dressing, hygiene/grooming, locomotion level, and caring for others  PARTICIPATION LIMITATIONS: meal prep, cleaning, laundry, driving, shopping, community activity, and yard work  PERSONAL FACTORS: Age, Fitness, Past/current experiences, and Time since onset of injury/illness/exacerbation are also affecting patient's functional outcome.   REHAB POTENTIAL: Good  CLINICAL DECISION MAKING: Evolving/moderate complexity  EVALUATION COMPLEXITY: Moderate   GOALS: Goals reviewed with patient? Yes  SHORT TERM GOALS: Target date: 09/22/2023  Pt will be ind with initial HEP Baseline: Goal status: INITIAL  2.  Pt will demo improved R knee ROM from 5-110 deg for amb Baseline:  Goal status: INITIAL   LONG TERM GOALS: Target date: 10/20/2023   Pt will be ind with management and progression of HEP Baseline:  Goal status: INITIAL  2.  Pt will have improved knee ROM from 0 to 120 deg for stair negotiation Baseline:  Goal status: INITIAL  3.  Pt will be able to ascend/descend stairs in normal reciprocal pattern for improved home mobility Baseline:  Goal status: INITIAL  4.  Pt will be able to walk independently x 1000' to access community Baseline:  Goal status: INITIAL  5.  Pt will demo 5 x STS of </=13 sec for decreased fall risk Baseline:  Goal status: INITIAL  6.  Pt will have improved LEFS to >/=48/80 to demo MCID Baseline:  Goal status: INITIAL   PLAN:  PT FREQUENCY: 1-2x/week  PT DURATION: 8 weeks  PLANNED INTERVENTIONS: 97164- PT Re-evaluation, 97750- Physical Performance Testing, 97110-Therapeutic exercises, 97530- Therapeutic activity, 97112- Neuromuscular re-education, 97535- Self Care, 63875- Manual therapy, [phone]- Gait training, [phone]- Aquatic Therapy, [phone]- Electrical stimulation (unattended), [phone]- Ionotophoresis 4mg /ml Dexamethasone, Patient/Family education, Balance training, Stair training, Taping, Dry Needling,  Joint mobilization, Spinal  mobilization, Cryotherapy, and Moist heat  PLAN FOR NEXT SESSION: Assess response to HEP -- modify/progress accordingly. Manual as needed for quad/hamstrings. Patellar mobs. Gentle knee ROM as tolerated. R hip and LE strengthening. Did she get compression?   Kealii Thueson L Quirino Kakos, PTA 09/01/2023, 4:13 PM

## 2023-09-08 ENCOUNTER — Ambulatory Visit

## 2023-09-08 DIAGNOSIS — G8929 Other chronic pain: Secondary | ICD-10-CM | POA: Diagnosis not present

## 2023-09-08 DIAGNOSIS — M6281 Muscle weakness (generalized): Secondary | ICD-10-CM

## 2023-09-08 DIAGNOSIS — R2689 Other abnormalities of gait and mobility: Secondary | ICD-10-CM | POA: Diagnosis not present

## 2023-09-08 DIAGNOSIS — M25551 Pain in right hip: Secondary | ICD-10-CM

## 2023-09-08 DIAGNOSIS — M25561 Pain in right knee: Secondary | ICD-10-CM | POA: Diagnosis not present

## 2023-09-08 DIAGNOSIS — M1711 Unilateral primary osteoarthritis, right knee: Secondary | ICD-10-CM | POA: Diagnosis not present

## 2023-09-08 DIAGNOSIS — M25661 Stiffness of right knee, not elsewhere classified: Secondary | ICD-10-CM

## 2023-09-08 NOTE — Therapy (Signed)
 OUTPATIENT PHYSICAL THERAPY LOWER EXTREMITY TREATMENT   Patient Name: Katrina Clements MRN: [id number] DOB:01/09/1966, 58 y.o., female Today's Date: 09/08/2023  END OF SESSION:  PT End of Session - 09/08/23 1619     Visit Number 3    Number of Visits 8    Date for PT Re-Evaluation 10/20/23    Authorization Type BCBS    PT Start Time 1531    PT Stop Time 1618    PT Time Calculation (min) 47 min               Past Medical History:  Diagnosis Date   Adenomyosis    Allergy    seasonal   Endometriosis    Hyperlipidemia    Thyroid  disease    Past Surgical History:  Procedure Laterality Date   BLADDER SUSPENSION     btl     LAPAROSCOPY     x 2   VAGINAL HYSTERECTOMY     total   Patient Active Problem List   Diagnosis Date Noted   Primary osteoarthritis of right knee 07/06/2023   Trochanteric bursitis of both hips 12/04/2022   Hives 12/04/2022   Adenomyosis 10/24/2020   Allergy 10/24/2020   Endometriosis 10/24/2020   Thyroid  disease 10/24/2020   Abnormal stress ECG 08/05/2020   Hypertension 08/05/2020   Obesity (BMI 30-39.9) 08/05/2020   Chest pain 06/05/2020   Family history of heart attack 06/05/2020   Arthralgia 12/08/2019   Class 1 obesity due to excess calories without serious comorbidity with body mass index (BMI) of 34.0 to 34.9 in adult 12/08/2019   Bilateral carpal tunnel syndrome 11/28/2018   Vitamin D  deficiency 10/10/2018   Hypocalcemia 10/10/2018   Numbness and tingling in right hand 10/10/2018   Cervical herniated disc 04/06/2018   Dyslipidemia (high LDL; low HDL) 08/27/2016   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/01/2016   Hypothyroidism due to Hashimoto's thyroiditis 12/08/2013   Hyperlipidemia 12/08/2013   Fibromyalgia 12/08/2013    PCP: Araceli Knight, PA-C  REFERRING PROVIDER: Gean Keels, MD  REFERRING DIAG: M17.11 (ICD-10-CM) - Primary osteoarthritis of right knee  THERAPY DIAG:  Muscle weakness (generalized)  Other abnormalities  of gait and mobility  Chronic pain of right knee  Pain in right hip  Stiffness of right knee, not elsewhere classified  Rationale for Evaluation and Treatment: Rehabilitation  ONSET DATE: ~3 weeks ago exacerbation, chronic pain in knee since Dec  SUBJECTIVE:   SUBJECTIVE STATEMENT: Pt reports her HEP hurt the other day when trying to do it.  PERTINENT HISTORY: N/a  PAIN:  Are you having pain? Yes: NPRS scale: 3 at rest, at worst >10 Pain location: R medial anterior knee (adductor insertion), lateral quad Pain description: throbs Aggravating factors: Walking, stairs, transfers Relieving factors: Propped and bent,   PRECAUTIONS: None  RED FLAGS: None   WEIGHT BEARING RESTRICTIONS: No  FALLS:  Has patient fallen in last 6 months? Yes. 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  DIAGNOSTIC FINDINGS: "MRI did  show some degenerative meniscal fraying, dominant finding on my interpretation was significant chondromalacia and some areas near full-thickness under the patella. Pain is predominately patellofemoral today."  PATIENT SURVEYS:  LEFS: 39/80 = 48.7%  COGNITION: Overall cognitive status: Within functional limits for tasks assessed     SENSATION: WFL  EDEMA:  Circumferential: 41 on L, 45 on R  MUSCLE LENGTH: Hamstrings: WNL  POSTURE: No Significant postural limitations  PALPATION: Highly tender to palpation R quad, adductors and  ITB  LOWER EXTREMITY ROM:  Active ROM Right eval Left eval  Hip flexion    Hip extension    Hip abduction    Hip adduction    Hip internal rotation    Hip external rotation    Knee flexion 105 * 125  Knee extension -13* 0  Ankle dorsiflexion    Ankle plantarflexion    Ankle inversion    Ankle eversion     (Blank rows = not tested, * = pain)  LOWER EXTREMITY MMT:  MMT Right eval Left eval  Hip flexion 5 4+  Hip extension 3+ 5  Hip abduction 3+ 3+  Hip adduction    Hip internal rotation    Hip external rotation    Knee flexion 3+ * 5  Knee extension 5 5  Ankle dorsiflexion    Ankle plantarflexion    Ankle inversion    Ankle eversion     (Blank rows = not tested, * = pain)  LOWER EXTREMITY SPECIAL TESTS:  Hip special tests: [name] test: negative and Ober's test: positive   FUNCTIONAL TESTS: (deferred due to pain eval day) 5 times sit to stand: TBA SLS: TBA  GAIT: Distance walked: Into clinic Assistive device utilized: Crutches 1 crutch on R initially Level of assistance: Modified independence Comments: Antalgic, decreased knee extension and weightbearing on R. Cues to use crutch on L                                                                                                                                TREATMENT DATE:  09/08/23 Nustep L6x31min UE/LE Supine ITB stretch 2x30" w/ strap Supine hamstring stretch 2x30" w/ strap Supine quad stretch 2x30" w/ strap Seated LAQ x 10 Massage gun to R ITB and quads  09/01/23 Bike pain Nustep L3x40min  Seated tibia on femur PA and AP mobs grade II for pain Patellar glides medial  IASTM to R ITB and VL closer to knee  Supine ITB stretch with strap 3x15"  Supine hamstring stretch with strap 2x30"  08/25/23 See HEP below    PATIENT EDUCATION:  Education details: HEP updates Person educated: Patient Education method: Explanation, Demonstration, and Handouts Education comprehension: verbalized understanding,  returned demonstration, and needs further education  HOME EXERCISE PROGRAM: Access Code: [email or web address] URL: [email or web address] Date: 09/08/2023 Prepared by: Jasten Guyette  Exercises - Modified Thomas Stretch  - 1 x daily - 7 x weekly - 2 sets - 1-2 min  hold - Hooklying Isometric Clamshell  - 1 x daily - 7 x weekly - 2 sets - 10 reps - Standing Hip Extension with Leg Bent and Support  - 1 x daily - 7 x weekly - 2 sets - 10 reps - Supine Hamstring Stretch with Strap  - 1 x daily - 7 x weekly - 2 sets - 2 reps - 30 sec hold - Supine ITB Stretch with Strap  - 1 x daily - 7 x weekly - 2 sets - 2 reps - 15-30 sec hold - Seated Long Arc Quad  - 1 x daily - 7 x weekly - 2 sets - 10 reps - Supine Quadriceps Stretch with Strap on Table  - 1 x daily - 7 x weekly - 2 sets - 2 reps - 30 sec hold  ASSESSMENT:  CLINICAL IMPRESSION: Continued with efforts to reduce muscle tension in thigh musculature to reduce knee pain. Her R ITB and quads are tight going into the stretches. Replaced quad sets w/ LAQ d/t increased knee pain with QS.  IE: Patient is a 58 y.o. F who was seen today for physical therapy evaluation and treatment for R knee pain and edema. Imaging significant for R meniscus fraying. PMH of bilat hip bursitis. Assessment significant for increased R quad spasm/trigger points resulting in overall decreased R knee ROM, decreased patellar mobility, and pain along with weak bilat hips and taut/tender ITB. R knee is currently edematous compared to L and discussed compression to help address along with her continued ice and elevation. Pt will greatly benefit from PT to improve on these deficits for return to PLOF.   OBJECTIVE IMPAIRMENTS: Abnormal gait, decreased activity tolerance, decreased balance, decreased endurance, decreased mobility, difficulty walking, decreased ROM, decreased strength, increased edema, increased fascial restrictions, increased muscle spasms, improper body mechanics,  postural dysfunction, and pain.   ACTIVITY LIMITATIONS: carrying, lifting, sitting, standing, squatting, sleeping, stairs, transfers, bed mobility, bathing, toileting, dressing, hygiene/grooming, locomotion level, and caring for others  PARTICIPATION LIMITATIONS: meal prep, cleaning, laundry, driving, shopping, community activity, and yard work  PERSONAL FACTORS: Age, Fitness, Past/current experiences, and Time since onset of injury/illness/exacerbation are also affecting patient's functional outcome.   REHAB POTENTIAL: Good  CLINICAL DECISION MAKING: Evolving/moderate complexity  EVALUATION COMPLEXITY: Moderate   GOALS: Goals reviewed with patient? Yes  SHORT TERM GOALS: Target date: 09/22/2023  Pt will be ind with initial HEP Baseline: Goal status: INITIAL  2.  Pt will demo improved R knee ROM from 5-110 deg for amb Baseline:  Goal status: INITIAL   LONG TERM GOALS: Target date: 10/20/2023   Pt will be ind with management and progression of HEP Baseline:  Goal status: INITIAL  2.  Pt will have improved knee ROM from 0 to 120 deg for stair negotiation Baseline:  Goal status: INITIAL  3.  Pt will be able to ascend/descend stairs in normal reciprocal pattern for improved home mobility Baseline:  Goal status: INITIAL  4.  Pt will be able to walk independently x 1000' to access community Baseline:  Goal status: INITIAL  5.  Pt will demo 5 x STS of </=13 sec for decreased fall risk Baseline:  Goal status: INITIAL  6.  Pt will have improved LEFS to >/=48/80 to demo MCID Baseline:  Goal status: INITIAL   PLAN:  PT FREQUENCY: 1-2x/week  PT DURATION: 8 weeks  PLANNED INTERVENTIONS: 97164- PT Re-evaluation, 97750- Physical Performance Testing, 97110-Therapeutic exercises, 97530- Therapeutic activity, [id number]- Neuromuscular re-education, 97535- Self Care, 16109-  Manual therapy, [id number]- Gait training, 16109- Aquatic Therapy, [phone]- Electrical stimulation (unattended),  [phone]- Ionotophoresis 4mg /ml Dexamethasone, Patient/Family education, Balance training, Stair training, Taping, Dry Needling, Joint mobilization, Spinal mobilization, Cryotherapy, and Moist heat  PLAN FOR NEXT SESSION: Assess response to HEP -- modify/progress accordingly. Manual as needed for quad/hamstrings. Patellar mobs. Gentle knee ROM as tolerated. R hip and LE strengthening. Did she get compression?   Adair Lemar L Daleigh Pollinger, PTA 09/08/2023, 4:20 PM

## 2023-09-16 ENCOUNTER — Ambulatory Visit

## 2023-09-16 DIAGNOSIS — G8929 Other chronic pain: Secondary | ICD-10-CM | POA: Diagnosis not present

## 2023-09-16 DIAGNOSIS — R2689 Other abnormalities of gait and mobility: Secondary | ICD-10-CM

## 2023-09-16 DIAGNOSIS — M25551 Pain in right hip: Secondary | ICD-10-CM

## 2023-09-16 DIAGNOSIS — M6281 Muscle weakness (generalized): Secondary | ICD-10-CM | POA: Diagnosis not present

## 2023-09-16 DIAGNOSIS — M25661 Stiffness of right knee, not elsewhere classified: Secondary | ICD-10-CM | POA: Diagnosis not present

## 2023-09-16 DIAGNOSIS — M25561 Pain in right knee: Secondary | ICD-10-CM | POA: Diagnosis not present

## 2023-09-16 DIAGNOSIS — M1711 Unilateral primary osteoarthritis, right knee: Secondary | ICD-10-CM | POA: Diagnosis not present

## 2023-09-16 NOTE — Therapy (Signed)
 OUTPATIENT PHYSICAL THERAPY LOWER EXTREMITY TREATMENT   Patient Name: Katrina Clements MRN: [id number] DOB:30-Dec-1965, 58 y.o., female Today's Date: 09/16/2023  END OF SESSION:  PT End of Session - 09/16/23 1712     Visit Number 4    Number of Visits 8    Date for PT Re-Evaluation 10/20/23    Authorization Type BCBS    PT Start Time 1618    PT Stop Time 1703    PT Time Calculation (min) 45 min                Past Medical History:  Diagnosis Date   Adenomyosis    Allergy    seasonal   Endometriosis    Hyperlipidemia    Thyroid  disease    Past Surgical History:  Procedure Laterality Date   BLADDER SUSPENSION     btl     LAPAROSCOPY     x 2   VAGINAL HYSTERECTOMY     total   Patient Active Problem List   Diagnosis Date Noted   Primary osteoarthritis of right knee 07/06/2023   Trochanteric bursitis of both hips 12/04/2022   Hives 12/04/2022   Adenomyosis 10/24/2020   Allergy 10/24/2020   Endometriosis 10/24/2020   Thyroid  disease 10/24/2020   Abnormal stress ECG 08/05/2020   Hypertension 08/05/2020   Obesity (BMI 30-39.9) 08/05/2020   Chest pain 06/05/2020   Family history of heart attack 06/05/2020   Arthralgia 12/08/2019   Class 1 obesity due to excess calories without serious comorbidity with body mass index (BMI) of 34.0 to 34.9 in adult 12/08/2019   Bilateral carpal tunnel syndrome 11/28/2018   Vitamin D  deficiency 10/10/2018   Hypocalcemia 10/10/2018   Numbness and tingling in right hand 10/10/2018   Cervical herniated disc 04/06/2018   Dyslipidemia (high LDL; low HDL) 08/27/2016   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/01/2016   Hypothyroidism due to Hashimoto's thyroiditis 12/08/2013   Hyperlipidemia 12/08/2013   Fibromyalgia 12/08/2013    PCP: Araceli Knight, PA-C  REFERRING PROVIDER: Gean Keels, MD  REFERRING DIAG: M17.11 (ICD-10-CM) - Primary osteoarthritis of right knee  THERAPY DIAG:  Muscle weakness (generalized)  Other  abnormalities of gait and mobility  Chronic pain of right knee  Pain in right hip  Stiffness of right knee, not elsewhere classified  Rationale for Evaluation and Treatment: Rehabilitation  ONSET DATE: ~3 weeks ago exacerbation, chronic pain in knee since Dec  SUBJECTIVE:   SUBJECTIVE STATEMENT: Pt reports she has a lot of swelling in her knee, she is still able to work on home renovations.   PERTINENT HISTORY: N/a  PAIN:  Are you having pain? Yes: NPRS scale: 3 at rest, at worst >10 Pain location: R medial anterior knee (adductor insertion), lateral quad Pain description: throbs Aggravating factors: Walking, stairs, transfers Relieving factors: Propped and bent,   PRECAUTIONS: None  RED FLAGS: None   WEIGHT BEARING RESTRICTIONS: No  FALLS:  Has patient fallen in last 6 months? Yes. 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  DIAGNOSTIC FINDINGS: "MRI did show some degenerative meniscal fraying, dominant finding on my interpretation was significant chondromalacia and some areas near full-thickness under the patella. Pain is predominately patellofemoral today."  PATIENT SURVEYS:  LEFS: 39/80 = 48.7%  COGNITION: Overall cognitive status: Within functional limits for tasks assessed     SENSATION: WFL  EDEMA:  Circumferential: 41 on L, 45 on R  MUSCLE LENGTH: Hamstrings: WNL  POSTURE: No Significant postural  limitations  PALPATION: Highly tender to palpation R quad, adductors and ITB  LOWER EXTREMITY ROM:  Active ROM Right eval Left eval R 09/16/23  Hip flexion     Hip extension     Hip abduction     Hip adduction     Hip internal rotation     Hip external rotation     Knee flexion 105 * 125 128  Knee extension -13* 0 4  Ankle dorsiflexion     Ankle plantarflexion     Ankle inversion     Ankle eversion      (Blank rows = not tested, * = pain)  LOWER EXTREMITY MMT:  MMT Right eval Left eval  Hip flexion 5 4+  Hip extension 3+ 5  Hip abduction 3+ 3+  Hip adduction    Hip internal rotation    Hip external rotation    Knee flexion 3+ * 5  Knee extension 5 5  Ankle dorsiflexion    Ankle plantarflexion    Ankle inversion    Ankle eversion     (Blank rows = not tested, * = pain)  LOWER EXTREMITY SPECIAL TESTS:  Hip special tests: [name] test: negative and Ober's test: positive   FUNCTIONAL TESTS: (deferred due to pain eval day) 5 times sit to stand: TBA SLS: TBA  GAIT: Distance walked: Into clinic Assistive device utilized: Crutches 1 crutch on R initially Level of assistance: Modified independence Comments: Antalgic, decreased knee extension and weightbearing on R. Cues to use crutch on L                                                                                                                                TREATMENT DATE:  09/16/23 Nustep L6x63min UE/LE Supine hip ADD ball squeeze x 10 Suine hip ADD + knee ext x 10 S/L clamshell RTB x 10 B Standing hip abduction 2 x 10 RTB thighs Standing hip extension 2 x 10 RTB thighs Massage gun to R ITB and quads Stairs: reciprocal pattern full flight  09/08/23 Nustep L6x48min UE/LE Supine ITB stretch 2x30" w/ strap Supine hamstring stretch 2x30" w/ strap Supine quad stretch 2x30" w/ strap Seated LAQ x 10 Massage gun to R ITB and quads  09/01/23 Bike pain Nustep L3x32min  Seated tibia on femur PA and AP mobs  grade II for pain Patellar glides medial  IASTM to R ITB and VL closer to knee  Supine ITB stretch with strap 3x15"  Supine hamstring stretch with strap 2x30"  08/25/23 See HEP below    PATIENT EDUCATION:  Education details: HEP updates Person educated: Patient Education method: Explanation, Demonstration, and Handouts Education comprehension: verbalized understanding, returned demonstration, and needs further education  HOME EXERCISE PROGRAM: Access Code: [email or web address] URL: [email or web address] Date: 09/16/2023 Prepared by: Jewel Mcafee  Exercises - Modified Thomas Stretch  - 1 x daily - 7 x weekly - 2 sets - 1-2 min hold - Standing Hip Extension with Leg Bent and Support  - 1 x daily - 7 x weekly - 2 sets - 10 reps - Supine Hamstring Stretch with Strap  - 1 x daily - 7 x weekly - 2 sets - 2 reps - 30 sec hold - Supine ITB Stretch with Strap  - 1 x daily - 7 x weekly - 2 sets - 2 reps - 15-30 sec hold - Supine Quadriceps Stretch with Strap on Table  - 1 x daily - 7 x weekly - 2 sets - 2 reps - 30 sec hold - Seated Long Arc Quad  - 1 x daily - 3 x weekly - 2 sets - 10 reps - Clamshell with Resistance  - 1 x daily - 3 x weekly - 2 sets - 10 reps  ASSESSMENT:  CLINICAL IMPRESSION: Pt is showing improvements in R knee ROM, however lacks 4 deg of extension. She has limited tolerance of knee extension stretching. Reviewed stair navigation with her using reciprocal pattern and she showed good tolerance for this. Still tight along the R ITB and lateral quads.  IE: Patient is a 58 y.o. F who was seen today for physical therapy evaluation and treatment for R knee pain and edema. Imaging significant for R meniscus fraying. PMH of bilat hip bursitis. Assessment significant for increased R quad spasm/trigger points resulting in overall decreased R knee ROM, decreased patellar mobility, and pain along with weak bilat hips and taut/tender ITB. R knee is currently edematous compared to L  and discussed compression to help address along with her continued ice and elevation. Pt will greatly benefit from PT to improve on these deficits for return to PLOF.   OBJECTIVE IMPAIRMENTS: Abnormal gait, decreased activity tolerance, decreased balance, decreased endurance, decreased mobility, difficulty walking, decreased ROM, decreased strength, increased edema, increased fascial restrictions, increased muscle spasms, improper body mechanics, postural dysfunction, and pain.   ACTIVITY LIMITATIONS: carrying, lifting, sitting, standing, squatting, sleeping, stairs, transfers, bed mobility, bathing, toileting, dressing, hygiene/grooming, locomotion level, and caring for others  PARTICIPATION LIMITATIONS: meal prep, cleaning, laundry, driving, shopping, community activity, and yard work  PERSONAL FACTORS: Age, Fitness, Past/current experiences, and Time since onset of injury/illness/exacerbation are also affecting patient's functional outcome.   REHAB POTENTIAL: Good  CLINICAL DECISION MAKING: Evolving/moderate complexity  EVALUATION COMPLEXITY: Moderate   GOALS: Goals reviewed with patient? Yes  SHORT TERM GOALS: Target date: 09/22/2023  Pt will be ind with initial HEP Baseline: Goal status: INITIAL  2.  Pt will demo improved R knee ROM from 5-110 deg for amb Baseline:  Goal status: MET- 09/16/23   LONG TERM GOALS: Target date: 10/20/2023   Pt will be ind with management and progression of HEP Baseline:  Goal status: INITIAL  2.  Pt will have improved knee ROM from 0 to 120 deg for stair negotiation Baseline:  Goal status: INITIAL  3.  Pt will be able to ascend/descend stairs in normal reciprocal pattern for improved home mobility Baseline:  Goal status: INITIAL  4.  Pt will be  able to walk independently x 1000' to access community Baseline:  Goal status: INITIAL  5.  Pt will demo 5 x STS of </=13 sec for decreased fall risk Baseline:  Goal status: INITIAL  6.  Pt  will have improved LEFS to >/=48/80 to demo MCID Baseline:  Goal status: INITIAL   PLAN:  PT FREQUENCY: 1-2x/week  PT DURATION: 8 weeks  PLANNED INTERVENTIONS: 97164- PT Re-evaluation, 97750- Physical Performance Testing, 97110-Therapeutic exercises, 97530- Therapeutic activity, [id number]- Neuromuscular re-education, 97535- Self Care, 14782- Manual therapy, [id number]- Gait training, [phone]- Aquatic Therapy, [phone]- Electrical stimulation (unattended), [phone]- Ionotophoresis 4mg /ml Dexamethasone, Patient/Family education, Balance training, Stair training, Taping, Dry Needling, Joint mobilization, Spinal mobilization, Cryotherapy, and Moist heat  PLAN FOR NEXT SESSION: Assess response to HEP -- modify/progress accordingly. Manual as needed for quad/hamstrings. Patellar mobs. Gentle knee ROM as tolerated. R hip and LE strengthening. Did she get compression?   Trevyn Lumpkin L Hebert Dooling, PTA 09/16/2023, 5:13 PM

## 2023-09-17 ENCOUNTER — Ambulatory Visit (INDEPENDENT_AMBULATORY_CARE_PROVIDER_SITE_OTHER): Admitting: Sports Medicine

## 2023-09-17 ENCOUNTER — Telehealth: Payer: Self-pay | Admitting: Sports Medicine

## 2023-09-17 ENCOUNTER — Other Ambulatory Visit (INDEPENDENT_AMBULATORY_CARE_PROVIDER_SITE_OTHER)

## 2023-09-17 DIAGNOSIS — M1711 Unilateral primary osteoarthritis, right knee: Secondary | ICD-10-CM | POA: Diagnosis not present

## 2023-09-17 MED ORDER — MELOXICAM 15 MG PO TABS: ORAL_TABLET | ORAL | 3 refills | Status: AC

## 2023-09-17 NOTE — Assessment & Plan Note (Signed)
 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 We are to get her approved for viscosupplementation, bilateral, she will do some research and let us  know if she truly wants to proceed. We also discussed geniculate artery embolization and arthroplasty. Today we did a therapeutic arthrocentesis.

## 2023-09-17 NOTE — Progress Notes (Signed)
    Procedures performed today:    Procedure: Real-time Ultrasound Guided aspiration right knee Device: Samsung [id number]  Verbal informed consent obtained.  Time-out conducted.  Noted no overlying erythema, induration, or other signs of local infection.  Skin prepped in a sterile fashion.  Local anesthesia: Topical Ethyl chloride.  With sterile technique and under real time ultrasound guidance: Aspirated 30 mL of clear, straw-colored fluid.  Completed without difficulty  Advised to call if fevers/chills, erythema, induration, drainage, or persistent bleeding.  Images permanently stored and available for review in PACS.  Impression: Technically successful ultrasound guided aspiration.  Independent interpretation of notes and tests performed by another provider:   None.  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 We are to get her approved for viscosupplementation, bilateral, she will do some research and let us  know if she truly wants to proceed. We also discussed geniculate artery embolization and arthroplasty. Today we did a therapeutic arthrocentesis.    ____________________________________________ Joselyn Nicely. Sandy Crumb, M.D., ABFM., CAQSM., AME. Primary Care and Sports Medicine [facility]  Adjunct Professor of [facility]  [employer]  Restaurant manager, fast food

## 2023-09-17 NOTE — Telephone Encounter (Addendum)
 Benefits Investigation Details received from [employer] Injection: Orthovisc PA required: Yes May fill through: Buy and Bill  OV Copay/Coinsurance: 20% Product Copay: 20% Administration Coinsurance: 20% Administration Copay: $ Deductible: $4000 (Met: $4000) Family Out of Pocket Max:  $4000 (Met:$825.38) Individual $8000 (Met: [id number]) Family  Benefits Investigation Started 09/17/2023  Deductible applies.  Since the deductible has been met, the patient is responsible for a coinsurance. Once the OOP has been met, the patient is covered at 100%.  Prior Authorization is required through [place] [facility] [place].  [employer] of [place] is requesting we proceed with preferred alternative gel injections before Orthovisc. Patient has the options between Euflexxa, Durolane, or Gel-Syn3.

## 2023-09-17 NOTE — Telephone Encounter (Signed)
 [employer] approval please. Bilateral

## 2023-09-20 NOTE — Telephone Encounter (Signed)
 Prior auth for [employer] submitted via fax. Confirmation that fax was received obtained.

## 2023-09-22 ENCOUNTER — Ambulatory Visit: Attending: Sports Medicine | Admitting: Physical Therapy

## 2023-09-22 DIAGNOSIS — M25551 Pain in right hip: Secondary | ICD-10-CM | POA: Insufficient documentation

## 2023-09-22 DIAGNOSIS — G8929 Other chronic pain: Secondary | ICD-10-CM | POA: Insufficient documentation

## 2023-09-22 DIAGNOSIS — R2689 Other abnormalities of gait and mobility: Secondary | ICD-10-CM | POA: Insufficient documentation

## 2023-09-22 DIAGNOSIS — M25661 Stiffness of right knee, not elsewhere classified: Secondary | ICD-10-CM | POA: Diagnosis present

## 2023-09-22 DIAGNOSIS — M25561 Pain in right knee: Secondary | ICD-10-CM | POA: Insufficient documentation

## 2023-09-22 DIAGNOSIS — M6281 Muscle weakness (generalized): Secondary | ICD-10-CM | POA: Insufficient documentation

## 2023-09-22 NOTE — Telephone Encounter (Signed)
 Rec'd fax from [facility] that this plan was not valid through them.   Called the number on the back of pt's insurance card to initiate a PA. Started the PA and submitted clinical information via fax as requested.

## 2023-09-22 NOTE — Therapy (Signed)
 OUTPATIENT PHYSICAL THERAPY LOWER EXTREMITY TREATMENT   Patient Name: Katrina Clements MRN: [id number] DOB:07-26-1965, 58 y.o., female Today's Date: 09/22/2023  END OF SESSION:  PT End of Session - 09/22/23 1538     Visit Number 5    Number of Visits 8    Date for PT Re-Evaluation 10/20/23    Authorization Type BCBS    PT Start Time 1536    PT Stop Time 1615    PT Time Calculation (min) 39 min                Past Medical History:  Diagnosis Date   Adenomyosis    Allergy    seasonal   Endometriosis    Hyperlipidemia    Thyroid  disease    Past Surgical History:  Procedure Laterality Date   BLADDER SUSPENSION     btl     LAPAROSCOPY     x 2   VAGINAL HYSTERECTOMY     total   Patient Active Problem List   Diagnosis Date Noted   Primary osteoarthritis of right knee 07/06/2023   Trochanteric bursitis of both hips 12/04/2022   Hives 12/04/2022   Adenomyosis 10/24/2020   Allergy 10/24/2020   Endometriosis 10/24/2020   Thyroid  disease 10/24/2020   Abnormal stress ECG 08/05/2020   Hypertension 08/05/2020   Obesity (BMI 30-39.9) 08/05/2020   Chest pain 06/05/2020   Family history of heart attack 06/05/2020   Arthralgia 12/08/2019   Class 1 obesity due to excess calories without serious comorbidity with body mass index (BMI) of 34.0 to 34.9 in adult 12/08/2019   Bilateral carpal tunnel syndrome 11/28/2018   Vitamin D  deficiency 10/10/2018   Hypocalcemia 10/10/2018   Numbness and tingling in right hand 10/10/2018   Cervical herniated disc 04/06/2018   Dyslipidemia (high LDL; low HDL) 08/27/2016   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/01/2016   Hypothyroidism due to Hashimoto's thyroiditis 12/08/2013   Hyperlipidemia 12/08/2013   Fibromyalgia 12/08/2013    PCP: Araceli Knight, PA-C  REFERRING PROVIDER: Gean Keels, MD  REFERRING DIAG: M17.11 (ICD-10-CM) - Primary osteoarthritis of right knee  THERAPY DIAG:  Muscle weakness (generalized)  Other  abnormalities of gait and mobility  Chronic pain of right knee  Pain in right hip  Stiffness of right knee, not elsewhere classified  Rationale for Evaluation and Treatment: Rehabilitation  ONSET DATE: ~3 weeks ago exacerbation, chronic pain in knee since Dec  SUBJECTIVE:   SUBJECTIVE STATEMENT: Pt states the R knee got drained by Dr. Elva Hamburger. It feels sore today. Has not been able to do her exercises.   PERTINENT HISTORY: N/a  PAIN:  Are you having pain? Yes: NPRS scale: 4-5 today, at worst >10 Pain location: R medial anterior knee (adductor insertion), lateral quad Pain description: throbs Aggravating factors: Walking, stairs, transfers Relieving factors: Propped and bent,   PRECAUTIONS: None  RED FLAGS: None   WEIGHT BEARING RESTRICTIONS: No  FALLS:  Has patient fallen in last 6 months? Yes. 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  DIAGNOSTIC FINDINGS: "MRI did show some degenerative meniscal fraying, dominant finding on my interpretation was significant chondromalacia and some areas near full-thickness under the patella. Pain is predominately patellofemoral today."  PATIENT SURVEYS:  LEFS: 39/80 = 48.7%  COGNITION: Overall cognitive status: Within functional limits for tasks assessed     SENSATION: WFL  EDEMA:  Circumferential: 41 on L, 45 on R  MUSCLE LENGTH: Hamstrings: WNL  POSTURE: No Significant postural  limitations  PALPATION: Highly tender to palpation R quad, adductors and ITB  LOWER EXTREMITY ROM:  Active ROM Right eval Left eval R 09/16/23  Hip flexion     Hip extension     Hip abduction     Hip adduction     Hip internal rotation     Hip external rotation     Knee flexion 105 * 125 128  Knee extension -13* 0 4  Ankle dorsiflexion     Ankle plantarflexion     Ankle inversion     Ankle eversion      (Blank rows = not tested, * = pain)  LOWER EXTREMITY MMT:  MMT Right eval Left eval  Hip flexion 5 4+  Hip extension 3+ 5  Hip abduction 3+ 3+  Hip adduction    Hip internal rotation    Hip external rotation    Knee flexion 3+ * 5  Knee extension 5 5  Ankle dorsiflexion    Ankle plantarflexion    Ankle inversion    Ankle eversion     (Blank rows = not tested, * = pain)  LOWER EXTREMITY SPECIAL TESTS:  Hip special tests: [name] test: negative and Ober's test: positive   FUNCTIONAL TESTS: (deferred due to pain eval day) 5 times sit to stand: TBA SLS: TBA  GAIT: Distance walked: Into clinic Assistive device utilized: Crutches 1 crutch on R initially Level of assistance: Modified independence Comments: Antalgic, decreased knee extension and weightbearing on R. Cues to use crutch on L                                                                                                                                TREATMENT DATE:  09/22/23 Nustep L5 x 5 min UEs/LEs Massage gun to R ITB, glute med, piriformis and quads Supine ITB stretch with strap x30" Supine piriformis stretch x30" S/L clamshell RTB 2x10 Bridge RTB 2x10 Seated LAQ red TB 2x10 K tape 2 "I" strips for improved patellar tracking (see HEP below)  09/16/23 Nustep L6x55min UE/LE Supine hip ADD ball squeeze x 10 Suine hip ADD + knee ext x 10 S/L clamshell RTB x 10 B Standing hip abduction 2 x 10 RTB thighs Standing hip extension 2 x 10 RTB thighs Massage gun to R ITB and quads Stairs:  reciprocal pattern full flight  09/08/23 Nustep L6x57min UE/LE Supine ITB stretch 2x30" w/ strap Supine hamstring stretch 2x30" w/ strap Supine quad stretch  2x30" w/ strap Seated LAQ x 10 Massage gun to R ITB and quads  09/01/23 Bike pain Nustep L3x29min  Seated tibia on femur PA and AP mobs grade II for pain Patellar glides medial  IASTM to R ITB and VL closer to knee  Supine ITB stretch with strap 3x15"  Supine hamstring stretch with strap 2x30"  08/25/23 See HEP below    PATIENT EDUCATION:  Education details: HEP updates, self massage, dry needling, cupping Person educated: Patient Education method: Explanation, Demonstration, and Handouts Education comprehension: verbalized understanding, returned demonstration, and needs further education  HOME EXERCISE PROGRAM: Access Code: [email or web address] URL: [email or web address] Date: 09/16/2023 Prepared by: Braylin Clark  Exercises - Modified Thomas Stretch  - 1 x daily - 7 x weekly - 2 sets - 1-2 min hold - Standing Hip Extension with Leg Bent and Support  - 1 x daily - 7 x weekly - 2 sets - 10 reps - Supine Hamstring Stretch with Strap  - 1 x daily - 7 x weekly - 2 sets - 2 reps - 30 sec hold - Supine ITB Stretch with Strap  - 1 x daily - 7 x weekly - 2 sets - 2 reps - 15-30 sec hold - Supine Quadriceps Stretch with Strap on Table  - 1 x daily - 7 x weekly - 2 sets - 2 reps - 30 sec hold - Seated Long Arc Quad  - 1 x daily - 3 x weekly - 2 sets - 10 reps - Clamshell with Resistance  - 1 x daily - 3 x weekly - 2 sets - 10 reps   ASSESSMENT:  CLINICAL IMPRESSION: Continued to work on improving pt's quad and ITB tightness. Less edema in R knee. Focused on strengthening hips and quads. Improved quad pain with LAQ after providing some tape for support. Can see increased tremors with quad contraction on R. Still has a hard time tolerating full knee extension. Discussed how to perform self massage with tennis ball if she does  not have a massage gun at home.   OBJECTIVE IMPAIRMENTS: Abnormal gait, decreased activity tolerance, decreased balance, decreased endurance, decreased mobility, difficulty walking, decreased ROM, decreased strength, increased edema, increased fascial restrictions, increased muscle spasms, improper body mechanics, postural dysfunction, and pain.   ACTIVITY LIMITATIONS: carrying, lifting, sitting, standing, squatting, sleeping, stairs, transfers, bed mobility, bathing, toileting, dressing, hygiene/grooming, locomotion level, and caring for others  PARTICIPATION LIMITATIONS: meal prep, cleaning, laundry, driving, shopping, community activity, and yard work  PERSONAL FACTORS: Age, Fitness, Past/current experiences, and Time since onset of injury/illness/exacerbation are also affecting patient's functional outcome.   REHAB POTENTIAL: Good  CLINICAL DECISION MAKING: Evolving/moderate complexity  EVALUATION COMPLEXITY: Moderate   GOALS: Goals reviewed with patient? Yes  SHORT TERM GOALS: Target date: 09/22/2023  Pt will be ind with initial HEP Baseline: Goal status: INITIAL  2.  Pt will demo improved R knee ROM from 5-110 deg for amb Baseline:  Goal status: MET- 09/16/23   LONG TERM GOALS: Target date: 10/20/2023   Pt will be ind with management and progression of HEP Baseline:  Goal status: INITIAL  2.  Pt will have improved knee ROM from 0 to 120 deg for stair negotiation Baseline:  Goal status: INITIAL  3.  Pt will be able to ascend/descend stairs in normal reciprocal pattern for improved home mobility Baseline:  Goal status: INITIAL  4.  Pt will be able to walk independently x 1000' to access community Baseline:  Goal status: INITIAL  5.  Pt will demo 5 x STS of </=13 sec for decreased fall risk Baseline:  Goal status: INITIAL  6.  Pt will have improved LEFS to >/=48/80 to demo MCID Baseline:  Goal status: INITIAL   PLAN:  PT FREQUENCY: 1-2x/week  PT DURATION:  8 weeks  PLANNED INTERVENTIONS: 97164- PT Re-evaluation, 97750- Physical Performance Testing, 97110-Therapeutic exercises, 97530- Therapeutic activity, [id number]- Neuromuscular re-education, 97535- Self Care, 16109- Manual therapy, [id number]- Gait training, [phone]- Aquatic Therapy, [phone]- Electrical stimulation (unattended), [phone]- Ionotophoresis 4mg /ml Dexamethasone, Patient/Family education, Balance training, Stair training, Taping, Dry Needling, Joint mobilization, Spinal mobilization, Cryotherapy, and Moist heat  PLAN FOR NEXT SESSION: Assess response to HEP -- modify/progress accordingly. Manual as needed for quad/hamstrings. Patellar mobs. Gentle knee ROM as tolerated. R hip and LE strengthening. How was taping?   Ailyn Gladd April Ma L Jawara Latorre, PT 09/22/2023, 3:38 PM

## 2023-09-29 ENCOUNTER — Ambulatory Visit: Payer: Self-pay

## 2023-09-29 DIAGNOSIS — M25551 Pain in right hip: Secondary | ICD-10-CM

## 2023-09-29 DIAGNOSIS — G8929 Other chronic pain: Secondary | ICD-10-CM | POA: Diagnosis not present

## 2023-09-29 DIAGNOSIS — M6281 Muscle weakness (generalized): Secondary | ICD-10-CM

## 2023-09-29 DIAGNOSIS — R2689 Other abnormalities of gait and mobility: Secondary | ICD-10-CM

## 2023-09-29 DIAGNOSIS — M25661 Stiffness of right knee, not elsewhere classified: Secondary | ICD-10-CM

## 2023-09-29 DIAGNOSIS — M25561 Pain in right knee: Secondary | ICD-10-CM | POA: Diagnosis not present

## 2023-09-29 NOTE — Therapy (Signed)
 OUTPATIENT PHYSICAL THERAPY LOWER EXTREMITY TREATMENT   Patient Name: Katrina Clements MRN: [id number] DOB:Feb 02, 1966, 58 y.o., female Today's Date: 09/29/2023  END OF SESSION:  PT End of Session - 09/29/23 0906     Visit Number 6    Number of Visits 8    Date for PT Re-Evaluation 10/20/23    Authorization Type BCBS    PT Start Time 0847    PT Stop Time 0929    PT Time Calculation (min) 42 min                 Past Medical History:  Diagnosis Date   Adenomyosis    Allergy    seasonal   Endometriosis    Hyperlipidemia    Thyroid  disease    Past Surgical History:  Procedure Laterality Date   BLADDER SUSPENSION     btl     LAPAROSCOPY     x 2   VAGINAL HYSTERECTOMY     total   Patient Active Problem List   Diagnosis Date Noted   Primary osteoarthritis of right knee 07/06/2023   Trochanteric bursitis of both hips 12/04/2022   Hives 12/04/2022   Adenomyosis 10/24/2020   Allergy 10/24/2020   Endometriosis 10/24/2020   Thyroid  disease 10/24/2020   Abnormal stress ECG 08/05/2020   Hypertension 08/05/2020   Obesity (BMI 30-39.9) 08/05/2020   Chest pain 06/05/2020   Family history of heart attack 06/05/2020   Arthralgia 12/08/2019   Class 1 obesity due to excess calories without serious comorbidity with body mass index (BMI) of 34.0 to 34.9 in adult 12/08/2019   Bilateral carpal tunnel syndrome 11/28/2018   Vitamin D  deficiency 10/10/2018   Hypocalcemia 10/10/2018   Numbness and tingling in right hand 10/10/2018   Cervical herniated disc 04/06/2018   Dyslipidemia (high LDL; low HDL) 08/27/2016   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/01/2016   Hypothyroidism due to Hashimoto's thyroiditis 12/08/2013   Hyperlipidemia 12/08/2013   Fibromyalgia 12/08/2013    PCP: Araceli Knight, PA-C  REFERRING PROVIDER: Gean Keels, MD  REFERRING DIAG: M17.11 (ICD-10-CM) - Primary osteoarthritis of right knee  THERAPY DIAG:  Muscle weakness (generalized)  Chronic pain of  right knee  Pain in right hip  Other abnormalities of gait and mobility  Stiffness of right knee, not elsewhere classified  Rationale for Evaluation and Treatment: Rehabilitation  ONSET DATE: ~3 weeks ago exacerbation, chronic pain in knee since Dec  SUBJECTIVE:   SUBJECTIVE STATEMENT: Pt reports doing more exercises lately, so more sore and stiff d/t it being morning.  PERTINENT HISTORY: N/a  PAIN:  Are you having pain? Yes: NPRS scale: 2 today, at worst >10 Pain location: R medial anterior knee (adductor insertion), lateral quad Pain description: throbs Aggravating factors: Walking, stairs, transfers Relieving factors: Propped and bent,   PRECAUTIONS: None  RED FLAGS: None   WEIGHT BEARING RESTRICTIONS: No  FALLS:  Has patient fallen in last 6 months? Yes. 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  DIAGNOSTIC  FINDINGS: MRI did show some degenerative meniscal fraying, dominant finding on my interpretation was significant chondromalacia and some areas near full-thickness under the patella. Pain is predominately patellofemoral today.  PATIENT SURVEYS:  LEFS: 39/80 = 48.7%  COGNITION: Overall cognitive status: Within functional limits for tasks assessed     SENSATION: WFL  EDEMA:  Circumferential: 41 on L, 45 on R  MUSCLE LENGTH: Hamstrings: WNL  POSTURE: No Significant postural limitations  PALPATION: Highly tender to palpation  R quad, adductors and ITB  LOWER EXTREMITY ROM:  Active ROM Right eval Left eval R 09/16/23  Hip flexion     Hip extension     Hip abduction     Hip adduction     Hip internal rotation     Hip external rotation     Knee flexion 105 * 125 128  Knee extension -13* 0 4  Ankle dorsiflexion     Ankle plantarflexion     Ankle inversion     Ankle eversion      (Blank rows = not tested, * = pain)  LOWER EXTREMITY MMT:  MMT Right eval Left eval R/L 09/29/23  Hip flexion 5 4+ 5/5  Hip extension 3+ 5 4-/4  Hip abduction 3+ 3+ 4-/4  Hip adduction     Hip internal rotation     Hip external rotation     Knee flexion 3+ * 5 4-/5  Knee extension 5 5 5/5  Ankle dorsiflexion     Ankle plantarflexion     Ankle inversion     Ankle eversion      (Blank rows = not tested, * = pain)  LOWER EXTREMITY SPECIAL TESTS:  Hip special tests: [name] test: negative and Ober's test: positive   FUNCTIONAL TESTS: (deferred due to pain eval day) 5 times sit to stand: TBA SLS: TBA  GAIT: Distance walked: Into clinic Assistive device utilized: Crutches 1 crutch on R initially Level of assistance: Modified independence Comments: Antalgic, decreased knee extension and weightbearing on R. Cues to use crutch on L                                                                                                                                TREATMENT DATE:  09/29/23 Nustep L5 x 6 min UE/LE MMT Seated R LAQ 2lb 2x10 Seated Hamstring curls GTB 2x10 Standing hip abduction,extension, add and flexion RTB anchored to furniture and around ankle  Single leg deadlift with UE support x 10 09/22/23 Nustep L5 x 5 min UEs/LEs Massage gun to R ITB, glute med, piriformis and quads Supine ITB stretch with strap x30 Supine piriformis stretch x30 S/L clamshell RTB 2x10 Bridge RTB 2x10 Seated LAQ red TB 2x10 K tape 2 I strips for improved patellar tracking (see HEP below)  09/16/23 Nustep L6x22min  UE/LE Supine hip ADD ball squeeze x 10 Suine hip ADD + knee ext x 10 S/L clamshell RTB x 10 B Standing  hip abduction 2 x 10 RTB thighs Standing hip extension 2 x 10 RTB thighs Massage gun to R ITB and quads Stairs: reciprocal pattern full flight  09/08/23 Nustep L6x56min UE/LE Supine ITB stretch 2x30 w/ strap Supine hamstring stretch 2x30 w/ strap Supine quad stretch 2x30 w/ strap Seated LAQ x 10 Massage gun to R ITB and quads  09/01/23 Bike pain Nustep L3x58min  Seated tibia on femur PA and AP mobs grade II for pain Patellar glides medial  IASTM to R ITB and VL closer to knee  Supine ITB stretch with strap 3x15  Supine hamstring stretch with strap 2x30  08/25/23 See HEP below    PATIENT EDUCATION:  Education details: HEP updates, self massage, dry needling, cupping Person educated: Patient Education method: Explanation, Demonstration, and Handouts Education comprehension: verbalized understanding, returned demonstration, and needs further education  HOME EXERCISE PROGRAM: Access Code: [email or web address] URL: [email or web address] Date: 09/16/2023 Prepared by: Amyr Sluder  Exercises - Modified Thomas Stretch  - 1 x daily - 7 x weekly - 2 sets - 1-2 min hold - Standing Hip Extension with Leg Bent and Support  - 1 x daily - 7 x weekly - 2 sets - 10 reps - Supine Hamstring Stretch with Strap  - 1 x daily - 7 x weekly - 2 sets - 2 reps - 30 sec hold - Supine ITB Stretch with Strap  - 1 x daily - 7 x weekly - 2 sets - 2 reps - 15-30 sec hold - Supine Quadriceps Stretch with Strap on Table  - 1 x daily - 7 x weekly - 2 sets - 2 reps - 30 sec hold - Seated Long Arc Quad  - 1 x daily - 3 x weekly - 2 sets - 10 reps - Clamshell with Resistance  - 1 x daily - 3 x weekly - 2 sets - 10 reps   ASSESSMENT:  CLINICAL IMPRESSION: Today we emphasized more balance and stabilization with proximal LE strength in standing. We also strengthened her knee musculature. LE strength  overall is improving but still lots of gluteal weakness noted with MMT. Pt with no complaints of increased pain but soreness with WB on R LE and pain with TKE. She does report KT tape is helpful and she tapes herself now.  OBJECTIVE IMPAIRMENTS: Abnormal gait, decreased activity tolerance, decreased balance, decreased endurance, decreased mobility, difficulty walking, decreased ROM, decreased strength, increased edema, increased fascial restrictions, increased muscle spasms, improper body mechanics, postural dysfunction, and pain.   ACTIVITY LIMITATIONS: carrying, lifting, sitting, standing, squatting, sleeping, stairs, transfers, bed mobility, bathing, toileting, dressing, hygiene/grooming, locomotion level, and caring for others  PARTICIPATION LIMITATIONS: meal prep, cleaning, laundry, driving, shopping, community activity, and yard work  PERSONAL FACTORS: Age, Fitness, Past/current experiences, and Time since onset of injury/illness/exacerbation are also affecting patient's functional outcome.   REHAB POTENTIAL: Good  CLINICAL DECISION MAKING: Evolving/moderate complexity  EVALUATION COMPLEXITY: Moderate   GOALS: Goals reviewed with patient? Yes  SHORT TERM GOALS: Target date: 09/22/2023  Pt will be ind with initial HEP Baseline: Goal status: INITIAL  2.  Pt will demo improved R knee ROM from 5-110 deg for amb Baseline:  Goal status: MET- 09/16/23   LONG TERM GOALS: Target date: 10/20/2023   Pt will be ind with management and progression of HEP Baseline:  Goal status: IN PROGRESSING- 09/29/23  2.  Pt will have improved knee ROM from 0 to 120 deg for stair negotiation Baseline:  Goal status: INITIAL  3.  Pt will be able to ascend/descend stairs in normal reciprocal pattern for improved home mobility Baseline:  Goal status: INITIAL  4.  Pt will be able to walk independently x 1000' to access community Baseline:  Goal status: INITIAL  5.  Pt will demo 5 x STS of </=13 sec  for decreased fall risk Baseline:  Goal status: INITIAL  6.  Pt will have improved LEFS to >/=48/80 to demo MCID Baseline:  Goal status: INITIAL   PLAN:  PT FREQUENCY: 1-2x/week  PT DURATION: 8 weeks  PLANNED INTERVENTIONS: 97164- PT Re-evaluation, 97750- Physical Performance Testing, 97110-Therapeutic exercises, 97530- Therapeutic activity, [id number]- Neuromuscular re-education, 97535- Self Care, [id number]- Manual therapy, [id number]- Gait training, [phone]- Aquatic Therapy, [phone]- Electrical stimulation (unattended), [phone]- Ionotophoresis 4mg /ml Dexamethasone, Patient/Family education, Balance training, Stair training, Taping, Dry Needling, Joint mobilization, Spinal mobilization, Cryotherapy, and Moist heat  PLAN FOR NEXT SESSION: Assess response to HEP -- modify/progress accordingly. Manual as needed for quad/hamstrings. Patellar mobs. Gentle knee ROM as tolerated. R hip and LE strengthening. How was taping?   Kahlin Mark L Jahlen Bollman, PTA 09/29/2023, 9:41 AM

## 2023-10-05 ENCOUNTER — Ambulatory Visit

## 2023-10-05 DIAGNOSIS — M25661 Stiffness of right knee, not elsewhere classified: Secondary | ICD-10-CM | POA: Diagnosis not present

## 2023-10-05 DIAGNOSIS — M25551 Pain in right hip: Secondary | ICD-10-CM

## 2023-10-05 DIAGNOSIS — G8929 Other chronic pain: Secondary | ICD-10-CM

## 2023-10-05 DIAGNOSIS — R2689 Other abnormalities of gait and mobility: Secondary | ICD-10-CM | POA: Diagnosis not present

## 2023-10-05 DIAGNOSIS — M25561 Pain in right knee: Secondary | ICD-10-CM | POA: Diagnosis not present

## 2023-10-05 DIAGNOSIS — M6281 Muscle weakness (generalized): Secondary | ICD-10-CM | POA: Diagnosis not present

## 2023-10-05 NOTE — Therapy (Signed)
 OUTPATIENT PHYSICAL THERAPY LOWER EXTREMITY TREATMENT   Patient Name: Katrina Clements MRN: [id number] DOB:14-Nov-1965, 58 y.o., female Today's Date: 10/05/2023  END OF SESSION:  PT End of Session - 10/05/23 1653     Visit Number 7    Number of Visits 8    Date for PT Re-Evaluation 10/20/23    Authorization Type BCBS    PT Start Time 1615    PT Stop Time 1702    PT Time Calculation (min) 47 min               Past Medical History:  Diagnosis Date   Adenomyosis    Allergy    seasonal   Endometriosis    Hyperlipidemia    Thyroid  disease    Past Surgical History:  Procedure Laterality Date   BLADDER SUSPENSION     btl     LAPAROSCOPY     x 2   VAGINAL HYSTERECTOMY     total   Patient Active Problem List   Diagnosis Date Noted   Primary osteoarthritis of right knee 07/06/2023   Trochanteric bursitis of both hips 12/04/2022   Hives 12/04/2022   Adenomyosis 10/24/2020   Allergy 10/24/2020   Endometriosis 10/24/2020   Thyroid  disease 10/24/2020   Abnormal stress ECG 08/05/2020   Hypertension 08/05/2020   Obesity (BMI 30-39.9) 08/05/2020   Chest pain 06/05/2020   Family history of heart attack 06/05/2020   Arthralgia 12/08/2019   Class 1 obesity due to excess calories without serious comorbidity with body mass index (BMI) of 34.0 to 34.9 in adult 12/08/2019   Bilateral carpal tunnel syndrome 11/28/2018   Vitamin D  deficiency 10/10/2018   Hypocalcemia 10/10/2018   Numbness and tingling in right hand 10/10/2018   Cervical herniated disc 04/06/2018   Dyslipidemia (high LDL; low HDL) 08/27/2016   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/01/2016   Hypothyroidism due to Hashimoto's thyroiditis 12/08/2013   Hyperlipidemia 12/08/2013   Fibromyalgia 12/08/2013    PCP: Araceli Knight, PA-C  REFERRING PROVIDER: Gean Keels, MD  REFERRING DIAG: M17.11 (ICD-10-CM) - Primary osteoarthritis of right knee  THERAPY DIAG:  Muscle weakness (generalized)  Chronic pain of  right knee  Pain in right hip  Other abnormalities of gait and mobility  Stiffness of right knee, not elsewhere classified  Rationale for Evaluation and Treatment: Rehabilitation  ONSET DATE: ~3 weeks ago exacerbation, chronic pain in knee since Dec  SUBJECTIVE:   SUBJECTIVE STATEMENT: Pt reports she was pretty sore after last visit   PERTINENT HISTORY: N/a  PAIN:  Are you having pain? Yes: NPRS scale: 2 today, at worst >10 Pain location: R medial anterior knee (adductor insertion), lateral quad Pain description: throbs Aggravating factors: Walking, stairs, transfers Relieving factors: Propped and bent,   PRECAUTIONS: None  RED FLAGS: None   WEIGHT BEARING RESTRICTIONS: No  FALLS:  Has patient fallen in last 6 months? Yes. 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  DIAGNOSTIC FINDINGS: MRI did show some degenerative meniscal  fraying, dominant finding on my interpretation was significant chondromalacia and some areas near full-thickness under the patella. Pain is predominately patellofemoral today.  PATIENT SURVEYS:  LEFS: 39/80 = 48.7%  COGNITION: Overall cognitive status: Within functional limits for tasks assessed     SENSATION: WFL  EDEMA:  Circumferential: 41 on L, 45 on R  MUSCLE LENGTH: Hamstrings: WNL  POSTURE: No Significant postural limitations  PALPATION: Highly tender to palpation R quad, adductors and ITB  LOWER  EXTREMITY ROM:  Active ROM Right eval Left eval R 09/16/23  Hip flexion     Hip extension     Hip abduction     Hip adduction     Hip internal rotation     Hip external rotation     Knee flexion 105 * 125 128  Knee extension -13* 0 4  Ankle dorsiflexion     Ankle plantarflexion     Ankle inversion     Ankle eversion      (Blank rows = not tested, * = pain)  LOWER EXTREMITY MMT:  MMT Right eval Left eval R/L 09/29/23  Hip flexion 5 4+ 5/5  Hip extension 3+ 5 4-/4  Hip abduction 3+ 3+ 4-/4  Hip adduction     Hip internal rotation     Hip external rotation     Knee flexion 3+ * 5 4-/5  Knee extension 5 5 5/5  Ankle dorsiflexion     Ankle plantarflexion     Ankle inversion     Ankle eversion      (Blank rows = not tested, * = pain)  LOWER EXTREMITY SPECIAL TESTS:  Hip special tests: [name] test: negative and Ober's test: positive   FUNCTIONAL TESTS: (deferred due to pain eval day) 5 times sit to stand: TBA SLS: TBA  GAIT: Distance walked: Into clinic Assistive device utilized: Crutches 1 crutch on R initially Level of assistance: Modified independence Comments: Antalgic, decreased knee extension and weightbearing on R. Cues to use crutch on L                                                                                                                                TREATMENT DATE:  10/05/23 THERAPEUTIC EXERCISE: To improve strength, endurance, ROM, and flexibility. Demonstration, verbal and tactile cues throughout for technique.  Nustep L5x6min UE/LE Seated LAQ L GTB + ball squeeze x 10; no ball squeeze on R x 10, 2x5 w/ ball squeeze    NEUROMUSCULAR RE-EDUCATION: To improve coordination, kinesthesia, posture, and proprioception.  KT tape to R knee I strips along each side of patella for stabilization Mini squat 5x Standing 4 way hip strengthening GTB anchored to furniture x 10 B  Single leg deadlift x 10 R/L; x 10 standing on airex R/L  THERAPEUTIC  ACTIVITIES: To improve functional performance.  Stairs: 14 ascending/descending- decreased eccentric control R knee  09/29/23 Nustep L5 x 6 min UE/LE MMT Seated R LAQ  2lb 2x10 Seated Hamstring curls GTB 2x10 Standing hip abduction,extension, add and flexion RTB anchored to furniture and around ankle  Single leg deadlift with UE support x 10 09/22/23 Nustep L5 x 5 min UEs/LEs Massage gun to R ITB, glute med, piriformis and quads Supine ITB stretch with strap x30 Supine piriformis stretch x30 S/L clamshell RTB 2x10 Bridge RTB 2x10 Seated LAQ red TB 2x10 K tape 2 I strips for improved patellar tracking (see HEP below)  09/16/23 Nustep L6x44min UE/LE Supine hip ADD ball squeeze x 10 Suine hip ADD + knee ext x 10 S/L clamshell RTB x 10 B Standing hip abduction 2 x 10 RTB thighs Standing hip extension 2 x 10 RTB thighs Massage gun to R ITB and quads Stairs: reciprocal pattern full flight  09/08/23 Nustep L6x54min UE/LE Supine ITB stretch 2x30 w/ strap Supine hamstring stretch 2x30 w/ strap Supine quad stretch 2x30 w/ strap Seated LAQ x 10 Massage gun to R ITB and quads  09/01/23 Bike pain Nustep L3x72min  Seated tibia on femur PA and AP mobs grade II for pain Patellar glides medial  IASTM to R ITB and VL closer to knee  Supine ITB stretch with strap 3x15  Supine hamstring stretch with strap 2x30  08/25/23 See HEP below    PATIENT EDUCATION:  Education details: HEP updates, self massage, dry needling, cupping Person educated: Patient Education method: Explanation, Demonstration, and Handouts Education comprehension: verbalized understanding, returned demonstration, and needs further education  HOME EXERCISE PROGRAM: Access Code: [email or web address] URL: [email or web address] Date: 09/16/2023 Prepared by: Harlow Basley  Exercises - Modified Thomas Stretch  - 1 x daily - 7 x weekly - 2 sets - 1-2 min hold - Standing Hip Extension with Leg Bent and Support  - 1  x daily - 7 x weekly - 2 sets - 10 reps - Supine Hamstring Stretch with Strap  - 1 x daily - 7 x weekly - 2 sets - 2 reps - 30 sec hold - Supine ITB Stretch with Strap  - 1 x daily - 7 x weekly - 2 sets - 2 reps - 15-30 sec hold - Supine Quadriceps Stretch with Strap on Table  - 1 x daily - 7 x weekly - 2 sets - 2 reps - 30 sec hold - Seated Long Arc Quad  - 1 x daily - 3 x weekly - 2 sets - 10 reps - Clamshell with Resistance  - 1 x daily - 3 x weekly - 2 sets - 10 reps   ASSESSMENT:  CLINICAL IMPRESSION: Emphasized standing stabilization and balance for strength and stability. Isolated strengthening focused on quads today. We also assessed stairs, main deficits shown was decreased R eccentric control. KT tape again today for patellar stabilization.   OBJECTIVE IMPAIRMENTS: Abnormal gait, decreased activity tolerance, decreased balance, decreased endurance, decreased mobility, difficulty walking, decreased ROM, decreased strength, increased edema, increased fascial restrictions, increased muscle spasms, improper body mechanics, postural dysfunction, and pain.   ACTIVITY LIMITATIONS: carrying, lifting, sitting, standing, squatting, sleeping, stairs, transfers, bed mobility, bathing, toileting, dressing, hygiene/grooming, locomotion level, and caring for others  PARTICIPATION LIMITATIONS: meal prep, cleaning, laundry, driving, shopping, community activity, and yard work  PERSONAL FACTORS: Age, Fitness, Past/current experiences, and Time since onset of injury/illness/exacerbation are also affecting patient's functional outcome.   REHAB POTENTIAL: Good  CLINICAL DECISION MAKING: Evolving/moderate complexity  EVALUATION COMPLEXITY: Moderate   GOALS: Goals reviewed with patient? Yes  SHORT TERM GOALS: Target date: 09/22/2023  Pt will be ind with  initial HEP Baseline: Goal status: MET- 10/05/23  2.  Pt will demo improved R knee ROM from 5-110 deg for amb Baseline:  Goal status: MET-  09/16/23   LONG TERM GOALS: Target date: 10/20/2023   Pt will be ind with management and progression of HEP Baseline:  Goal status: IN PROGRESSING- 09/29/23  2.  Pt will have improved knee ROM from 0 to 120 deg for stair negotiation Baseline:  Goal status: INITIAL  3.  Pt will be able to ascend/descend stairs in normal reciprocal pattern for improved home mobility Baseline:  Goal status: IN PROGRESS; 10/05/23 see treatment  4.  Pt will be able to walk independently x 1000' to access community Baseline:  Goal status: INITIAL  5.  Pt will demo 5 x STS of </=13 sec for decreased fall risk Baseline:  Goal status: INITIAL  6.  Pt will have improved LEFS to >/=48/80 to demo MCID Baseline:  Goal status: INITIAL   PLAN:  PT FREQUENCY: 1-2x/week  PT DURATION: 8 weeks  PLANNED INTERVENTIONS: 97164- PT Re-evaluation, 97750- Physical Performance Testing, 97110-Therapeutic exercises, 97530- Therapeutic activity, 97112- Neuromuscular re-education, 97535- Self Care, 78295- Manual therapy, [phone]- Gait training, [phone]- Aquatic Therapy, [phone]- Electrical stimulation (unattended), [phone]- Ionotophoresis 4mg /ml Dexamethasone, Patient/Family education, Balance training, Stair training, Taping, Dry Needling, Joint mobilization, Spinal mobilization, Cryotherapy, and Moist heat  PLAN FOR NEXT SESSION: keep working on standing strength/stabilization,    Samuella Crocker, PTA 10/05/2023, 5:13 PM

## 2023-10-13 ENCOUNTER — Ambulatory Visit

## 2023-10-13 DIAGNOSIS — G8929 Other chronic pain: Secondary | ICD-10-CM | POA: Diagnosis not present

## 2023-10-13 DIAGNOSIS — M25561 Pain in right knee: Secondary | ICD-10-CM | POA: Diagnosis not present

## 2023-10-13 DIAGNOSIS — M6281 Muscle weakness (generalized): Secondary | ICD-10-CM | POA: Diagnosis not present

## 2023-10-13 DIAGNOSIS — R2689 Other abnormalities of gait and mobility: Secondary | ICD-10-CM | POA: Diagnosis not present

## 2023-10-13 DIAGNOSIS — M25661 Stiffness of right knee, not elsewhere classified: Secondary | ICD-10-CM

## 2023-10-13 DIAGNOSIS — M25551 Pain in right hip: Secondary | ICD-10-CM

## 2023-10-13 NOTE — Telephone Encounter (Signed)
 Spoke with patient regarding the cost of the injections ($194 per week). Also, that her insurance company is requiring a different brand name that would need to be ordered. She verbalized understanding and will call back to let us  know if she wants to proceed with injections.

## 2023-10-13 NOTE — Therapy (Signed)
 OUTPATIENT PHYSICAL THERAPY LOWER EXTREMITY TREATMENT   Patient Name: Katrina Clements MRN: [id number] DOB:05-Sep-1965, 58 y.o., female Today's Date: 10/14/2023  END OF SESSION:  PT End of Session - 10/13/23 1707     Visit Number 8    Date for PT Re-Evaluation 10/20/23    Authorization Type BCBS    PT Start Time 1617    PT Stop Time 1700    PT Time Calculation (min) 43 min                Past Medical History:  Diagnosis Date   Adenomyosis    Allergy    seasonal   Endometriosis    Hyperlipidemia    Thyroid  disease    Past Surgical History:  Procedure Laterality Date   BLADDER SUSPENSION     btl     LAPAROSCOPY     x 2   VAGINAL HYSTERECTOMY     total   Patient Active Problem List   Diagnosis Date Noted   Primary osteoarthritis of right knee 07/06/2023   Trochanteric bursitis of both hips 12/04/2022   Hives 12/04/2022   Adenomyosis 10/24/2020   Allergy 10/24/2020   Endometriosis 10/24/2020   Thyroid  disease 10/24/2020   Abnormal stress ECG 08/05/2020   Hypertension 08/05/2020   Obesity (BMI 30-39.9) 08/05/2020   Chest pain 06/05/2020   Family history of heart attack 06/05/2020   Arthralgia 12/08/2019   Class 1 obesity due to excess calories without serious comorbidity with body mass index (BMI) of 34.0 to 34.9 in adult 12/08/2019   Bilateral carpal tunnel syndrome 11/28/2018   Vitamin D  deficiency 10/10/2018   Hypocalcemia 10/10/2018   Numbness and tingling in right hand 10/10/2018   Cervical herniated disc 04/06/2018   Dyslipidemia (high LDL; low HDL) 08/27/2016   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/01/2016   Hypothyroidism due to Hashimoto's thyroiditis 12/08/2013   Hyperlipidemia 12/08/2013   Fibromyalgia 12/08/2013    PCP: Antoniette Vermell CROME, PA-C  REFERRING PROVIDER: Curtis Debby PARAS, MD  REFERRING DIAG: M17.11 (ICD-10-CM) - Primary osteoarthritis of right knee  THERAPY DIAG:  Muscle weakness (generalized)  Chronic pain of right knee  Pain in  right hip  Other abnormalities of gait and mobility  Stiffness of right knee, not elsewhere classified  Rationale for Evaluation and Treatment: Rehabilitation  ONSET DATE: ~3 weeks ago exacerbation, chronic pain in knee since Dec  SUBJECTIVE:   SUBJECTIVE STATEMENT: Pt reports she was pretty sore after last visit   PERTINENT HISTORY: N/a  PAIN:  Are you having pain? Yes: NPRS scale: 2 today, at worst >10 Pain location: R medial anterior knee (adductor insertion), lateral quad Pain description: throbs Aggravating factors: Walking, stairs, transfers Relieving factors: Propped and bent,   PRECAUTIONS: None  RED FLAGS: None   WEIGHT BEARING RESTRICTIONS: No  FALLS:  Has patient fallen in last 6 months? Yes. 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  DIAGNOSTIC FINDINGS: MRI did show some degenerative meniscal fraying, dominant finding on my interpretation  was significant chondromalacia and some areas near full-thickness under the patella. Pain is predominately patellofemoral today.  PATIENT SURVEYS:  LEFS: 39/80 = 48.7%  COGNITION: Overall cognitive status: Within functional limits for tasks assessed     SENSATION: WFL  EDEMA:  Circumferential: 41 on L, 45 on R  MUSCLE LENGTH: Hamstrings: WNL  POSTURE: No Significant postural limitations  PALPATION: Highly tender to palpation R quad, adductors and ITB  LOWER EXTREMITY  ROM:  Active ROM Right eval Left eval R 09/16/23  Hip flexion     Hip extension     Hip abduction     Hip adduction     Hip internal rotation     Hip external rotation     Knee flexion 105 * 125 128  Knee extension -13* 0 4  Ankle dorsiflexion     Ankle plantarflexion     Ankle inversion     Ankle eversion      (Blank rows = not tested, * = pain)  LOWER EXTREMITY MMT:  MMT Right eval Left eval R/L 09/29/23  Hip flexion 5 4+ 5/5  Hip extension 3+ 5 4-/4  Hip abduction 3+ 3+ 4-/4  Hip adduction     Hip internal rotation     Hip external rotation     Knee flexion 3+ * 5 4-/5  Knee extension 5 5 5/5  Ankle dorsiflexion     Ankle plantarflexion     Ankle inversion     Ankle eversion      (Blank rows = not tested, * = pain)  LOWER EXTREMITY SPECIAL TESTS:  Hip special tests: Debby test: negative and Ober's test: positive   FUNCTIONAL TESTS: (deferred due to pain eval day) 5 times sit to stand: 22 sec SLS: TBA  GAIT: Distance walked: Into clinic Assistive device utilized: Crutches 1 crutch on R initially Level of assistance: Modified independence Comments: Antalgic, decreased knee extension and weightbearing on R. Cues to use crutch on L                                                                                                                                TREATMENT DATE:  10/13/23 Nustep L6x94min US  to R knee over meniscus 1 MHz, 1.2 W/cm2 x 8 min Knee joint mobs grade II-III PA and AP supine PROM stretching for R knee flex and ext 5xSTS- 22 sec KT tape chondromalacia pattern  10/05/23 THERAPEUTIC EXERCISE: To improve strength, endurance, ROM, and flexibility. Demonstration, verbal and tactile cues throughout for technique.  Nustep L5x6min UE/LE Seated LAQ L GTB + ball squeeze x 10; no ball squeeze on R x 10, 2x5 w/ ball squeeze    NEUROMUSCULAR RE-EDUCATION: To improve coordination, kinesthesia, posture, and proprioception.  KT tape to R knee I  strips along each side of patella for stabilization Mini squat 5x Standing 4 way hip strengthening GTB anchored to furniture x 10 B  Single leg deadlift x  10 R/L; x 10 standing on airex R/L  THERAPEUTIC ACTIVITIES: To improve functional performance.  Stairs: 14 ascending/descending- decreased eccentric control R knee  09/29/23 Nustep L5 x 6 min UE/LE MMT Seated R LAQ 2lb 2x10 Seated Hamstring curls GTB 2x10 Standing hip abduction,extension, add and flexion RTB anchored to furniture and around ankle  Single leg deadlift with UE support x 10 09/22/23 Nustep L5 x 5 min UEs/LEs Massage gun to R ITB, glute med, piriformis and quads Supine ITB stretch with strap x30 Supine piriformis stretch x30 S/L clamshell RTB 2x10 Bridge RTB 2x10 Seated LAQ red TB 2x10 K tape 2 I strips for improved patellar tracking (see HEP below)  09/16/23 Nustep L6x55min UE/LE Supine hip ADD ball squeeze x 10 Suine hip ADD + knee ext x 10 S/L clamshell RTB x 10 B Standing hip abduction 2 x 10 RTB thighs Standing hip extension 2 x 10 RTB thighs Massage gun to R ITB and quads Stairs: reciprocal pattern full flight  09/08/23 Nustep L6x46min UE/LE Supine ITB stretch 2x30 w/ strap Supine hamstring stretch 2x30 w/ strap Supine quad stretch 2x30 w/ strap Seated LAQ x 10 Massage gun to R ITB and quads  09/01/23 Bike pain Nustep L3x72min  Seated tibia on femur PA and AP mobs grade II for pain Patellar glides medial  IASTM to R ITB and VL closer to knee  Supine ITB stretch with strap 3x15  Supine hamstring stretch with strap 2x30  08/25/23 See HEP below    PATIENT EDUCATION:  Education details: HEP updates, self massage, dry needling, cupping Person educated: Patient Education method: Explanation, Demonstration, and Handouts Education comprehension: verbalized understanding, returned demonstration, and needs further education  HOME EXERCISE PROGRAM: Access Code: [email or web address] URL:  [email or web address] Date: 09/16/2023 Prepared by: Yarima Penman  Exercises - Modified Thomas Stretch  - 1 x daily - 7 x weekly - 2 sets - 1-2 min hold - Standing Hip Extension with Leg Bent and Support  - 1 x daily - 7 x weekly - 2 sets - 10 reps - Supine Hamstring Stretch with Strap  - 1 x daily - 7 x weekly - 2 sets - 2 reps - 30 sec hold - Supine ITB Stretch with Strap  - 1 x daily - 7 x weekly - 2 sets - 2 reps - 15-30 sec hold - Supine Quadriceps Stretch with Strap on Table  - 1 x daily - 7 x weekly - 2 sets - 2 reps - 30 sec hold - Seated Long Arc Quad  - 1 x daily - 3 x weekly - 2 sets - 10 reps - Clamshell with Resistance  - 1 x daily - 3 x weekly - 2 sets - 10 reps   ASSESSMENT:  CLINICAL IMPRESSION: Pt with increased pain today from attempting lunge motion while gardening. Introduced US  to help with inflammation and tissue healing. Followed with joint mobs and stretching for R knee to tolerance. We did assess 5xSTS today as well. Pt is to see MD 7/7 and looking into getting gel injections for R knee.   OBJECTIVE IMPAIRMENTS: Abnormal gait, decreased activity tolerance, decreased balance, decreased endurance, decreased mobility, difficulty walking, decreased ROM, decreased strength, increased edema, increased fascial restrictions, increased muscle spasms, improper body mechanics, postural dysfunction, and pain.   ACTIVITY LIMITATIONS: carrying, lifting, sitting, standing, squatting, sleeping, stairs, transfers, bed mobility, bathing, toileting, dressing, hygiene/grooming, locomotion level, and caring for others  PARTICIPATION LIMITATIONS: meal prep, cleaning, laundry, driving, shopping, community activity, and yard work  PERSONAL FACTORS: Age, Fitness, Past/current experiences, and Time since onset of injury/illness/exacerbation are also affecting patient's functional outcome.   REHAB POTENTIAL: Good  CLINICAL DECISION MAKING: Evolving/moderate  complexity  EVALUATION COMPLEXITY: Moderate   GOALS: Goals reviewed with patient? Yes  SHORT TERM GOALS: Target date: 09/22/2023  Pt will be ind with initial HEP Baseline: Goal status: MET- 10/05/23  2.  Pt will demo improved R knee ROM from 5-110 deg for amb Baseline:  Goal status: MET- 09/16/23   LONG TERM GOALS: Target date: 10/20/2023   Pt will be ind with management and progression of HEP Baseline:  Goal status: IN PROGRESSING- 09/29/23  2.  Pt will have improved knee ROM from 0 to 120 deg for stair negotiation Baseline:  Goal status: INITIAL  3.  Pt will be able to ascend/descend stairs in normal reciprocal pattern for improved home mobility Baseline:  Goal status: IN PROGRESS; 10/05/23 see treatment  4.  Pt will be able to walk independently x 1000' to access community Baseline:  Goal status: INITIAL  5.  Pt will demo 5 x STS of </=13 sec for decreased fall risk Baseline:  Goal status: INITIAL  6.  Pt will have improved LEFS to >/=48/80 to demo MCID Baseline:  Goal status: INITIAL   PLAN:  PT FREQUENCY: 1-2x/week  PT DURATION: 8 weeks  PLANNED INTERVENTIONS: 97164- PT Re-evaluation, 97750- Physical Performance Testing, 97110-Therapeutic exercises, 97530- Therapeutic activity, 97112- Neuromuscular re-education, 97535- Self Care, [id number]- Manual therapy, [phone]- Gait training, [phone]- Aquatic Therapy, [phone]- Electrical stimulation (unattended), [phone]- Ionotophoresis 4mg /ml Dexamethasone, Patient/Family education, Balance training, Stair training, Taping, Dry Needling, Joint mobilization, Spinal mobilization, Cryotherapy, and Moist heat  PLAN FOR NEXT SESSION: keep working on standing strength/stabilization,    Sol LITTIE Gaskins, PTA 10/14/2023, 8:35 AM

## 2023-10-19 NOTE — Telephone Encounter (Signed)
 Pt called back and verbalized that she wants to proceed with ordering the injections that will be covered by her insurance. This message is for [name].

## 2023-10-20 ENCOUNTER — Ambulatory Visit: Attending: Sports Medicine | Admitting: Physical Therapy

## 2023-10-20 ENCOUNTER — Encounter: Payer: Self-pay | Admitting: Physical Therapy

## 2023-10-20 DIAGNOSIS — M6281 Muscle weakness (generalized): Secondary | ICD-10-CM | POA: Insufficient documentation

## 2023-10-20 DIAGNOSIS — R2689 Other abnormalities of gait and mobility: Secondary | ICD-10-CM | POA: Diagnosis present

## 2023-10-20 DIAGNOSIS — M25561 Pain in right knee: Secondary | ICD-10-CM | POA: Diagnosis present

## 2023-10-20 DIAGNOSIS — M25661 Stiffness of right knee, not elsewhere classified: Secondary | ICD-10-CM | POA: Diagnosis present

## 2023-10-20 DIAGNOSIS — M25551 Pain in right hip: Secondary | ICD-10-CM | POA: Insufficient documentation

## 2023-10-20 DIAGNOSIS — G8929 Other chronic pain: Secondary | ICD-10-CM | POA: Diagnosis present

## 2023-10-20 NOTE — Telephone Encounter (Signed)
 Msg to Katrina Clements to order the euflexxa for patient.

## 2023-10-20 NOTE — Therapy (Signed)
 OUTPATIENT PHYSICAL THERAPY LOWER EXTREMITY TREATMENT AND RE-CERT   Patient Name: Katrina Clements MRN: [id number] DOB:[birth year], 58 y.o., female Today's Date: 10/20/2023  END OF SESSION:  PT End of Session - 10/20/23 1519     Visit Number 9    Date for PT Re-Evaluation 12/01/23    Authorization Type BCBS    PT Start Time 1525    PT Stop Time 1605    PT Time Calculation (min) 40 min    Activity Tolerance Patient tolerated treatment well;Patient limited by fatigue    Behavior During Therapy WFL for tasks assessed/performed            Past Medical History:  Diagnosis Date   Adenomyosis    Allergy    seasonal   Endometriosis    Hyperlipidemia    Thyroid  disease    Past Surgical History:  Procedure Laterality Date   BLADDER SUSPENSION     btl     LAPAROSCOPY     x 2   VAGINAL HYSTERECTOMY     total   Patient Active Problem List   Diagnosis Date Noted   Primary osteoarthritis of right knee 07/06/2023   Trochanteric bursitis of both hips 12/04/2022   Hives 12/04/2022   Adenomyosis 10/24/2020   Allergy 10/24/2020   Endometriosis 10/24/2020   Thyroid  disease 10/24/2020   Abnormal stress ECG 08/05/2020   Hypertension 08/05/2020   Obesity (BMI 30-39.9) 08/05/2020   Chest pain 06/05/2020   Family history of heart attack 06/05/2020   Arthralgia 12/08/2019   Class 1 obesity due to excess calories without serious comorbidity with body mass index (BMI) of 34.0 to 34.9 in adult 12/08/2019   Bilateral carpal tunnel syndrome 11/28/2018   Vitamin D  deficiency 10/10/2018   Hypocalcemia 10/10/2018   Numbness and tingling in right hand 10/10/2018   Cervical herniated disc 04/06/2018   Dyslipidemia (high LDL; low HDL) 08/27/2016   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/01/2016   Hypothyroidism due to Hashimoto's thyroiditis 12/08/2013   Hyperlipidemia 12/08/2013   Fibromyalgia 12/08/2013    PCP: Antoniette Vermell CROME, PA-C  REFERRING PROVIDER: Curtis Debby PARAS, MD  REFERRING DIAG:  M17.11 (ICD-10-CM) - Primary osteoarthritis of right knee  THERAPY DIAG:  Muscle weakness (generalized)  Chronic pain of right knee  Pain in right hip  Other abnormalities of gait and mobility  Stiffness of right knee, not elsewhere classified  Rationale for Evaluation and Treatment: Rehabilitation  ONSET DATE: ~3 weeks ago exacerbation, chronic pain in knee since Dec  SUBJECTIVE:   SUBJECTIVE STATEMENT: Pt states she will be getting her injection. Pt reports constant sit to stand bother her knee the most. Walking has been fine. Has only done her exercises once this week. Pt states exercises have been getting easier.   PERTINENT HISTORY: N/a  PAIN:  Are you having pain? Yes: NPRS scale: 2 today, at worst >10 Pain location: R medial anterior knee (adductor insertion), lateral quad Pain description: throbs Aggravating factors: Walking, stairs, transfers Relieving factors: Propped and bent,   PRECAUTIONS: None  RED FLAGS: None   WEIGHT BEARING RESTRICTIONS: No  FALLS:  Has patient fallen in last 6 months? Yes. 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  DIAGNOSTIC FINDINGS: MRI did show some degenerative meniscal fraying, dominant finding on my interpretation was significant chondromalacia and some areas near full-thickness under the patella. Pain is predominately patellofemoral today.  PATIENT SURVEYS:  LEFS: 39/80 =  48.7%  COGNITION: Overall cognitive status: Within functional limits for tasks assessed     SENSATION: WFL  EDEMA:  Circumferential: 41 on L, 45 on R  MUSCLE LENGTH: Hamstrings: WNL  POSTURE: No Significant postural limitations  PALPATION: Highly tender to palpation R quad, adductors and ITB  LOWER EXTREMITY ROM:  Active ROM Right eval Left eval R 09/16/23 R 10/20/23  Hip flexion      Hip extension      Hip abduction      Hip adduction      Hip internal rotation      Hip external rotation      Knee flexion 105 * 125 128 125 AROM  Knee extension -13* 0 4 5 PROM 10 AROM   Ankle dorsiflexion      Ankle plantarflexion      Ankle inversion      Ankle eversion       (Blank rows = not tested, * = pain)  LOWER EXTREMITY MMT:  MMT Right eval Left eval R/L 09/29/23  Hip flexion 5 4+ 5/5  Hip extension 3+ 5 4-/4  Hip abduction 3+ 3+ 4-/4  Hip adduction     Hip internal rotation     Hip external rotation     Knee flexion 3+ * 5 4-/5  Knee extension 5 5 5/5  Ankle dorsiflexion     Ankle plantarflexion     Ankle inversion     Ankle eversion      (Blank rows = not tested, * = pain)  LOWER EXTREMITY SPECIAL TESTS:  Hip special tests: Debby test: negative and Ober's test: positive   FUNCTIONAL TESTS: (deferred due to pain eval day) 5 times sit to stand: 22 sec  SLS: TBA   GAIT: Distance walked: Into clinic Assistive device utilized: Crutches 1 crutch on R initially Level of assistance: Modified independence Comments: Antalgic, decreased knee extension and weightbearing on R. Cues to use crutch on L                                                                                                                                TREATMENT DATE:  10/20/23 Nustep L5 x 6 min Standing calf stretch x 30 Seated hip flexor stretch x 30 Seated quad set x10 Seated quad set + SLR with strap 2x5 Step up 6 step 2x5 Step downs 6 step x5 Side step downs 6 x5 Side step  red TB x4 laps by counter Fwd and bwd monster walk red TB around ankles  x4 laps by counter Rechecked goals  10/13/23 Nustep L6x13min US  to R knee over meniscus 1 MHz, 1.2 W/cm2 x 8 min Knee joint mobs grade II-III PA and AP supine PROM stretching for R knee flex and ext 5xSTS- 22 sec KT tape chondromalacia pattern  10/05/23 THERAPEUTIC EXERCISE: To improve strength, endurance, ROM, and flexibility. Demonstration, verbal and tactile cues throughout for technique.  Nustep L5x6min UE/LE Seated LAQ L GTB + ball squeeze x 10; no ball squeeze on R x 10, 2x5 w/ ball squeeze NEUROMUSCULAR RE-EDUCATION: To improve coordination, kinesthesia, posture, and proprioception.  KT tape to R knee I strips along each side of patella for stabilization Mini squat 5x Standing 4 way hip strengthening GTB anchored to furniture x 10 B  Single leg deadlift x 10 R/L; x 10 standing on airex R/L THERAPEUTIC ACTIVITIES: To improve functional performance.  Stairs: 14 ascending/descending- decreased eccentric control R knee  09/29/23 Nustep L5 x 6 min UE/LE MMT Seated R LAQ 2lb 2x10 Seated Hamstring curls GTB 2x10 Standing hip abduction,extension, add and flexion RTB anchored to furniture and around ankle  Single leg deadlift with UE support x 10  09/22/23 Nustep L5 x 5 min UEs/LEs Massage gun to R ITB, glute med, piriformis and quads Supine ITB stretch with strap x30 Supine piriformis stretch x30 S/L clamshell RTB 2x10 Bridge RTB 2x10 Seated LAQ red TB 2x10 K tape 2 I strips for improved patellar tracking (see HEP below)  09/16/23 Nustep L6x48min UE/LE Supine hip ADD ball squeeze x 10 Suine hip ADD + knee ext x 10 S/L clamshell RTB x 10 B Standing hip abduction 2 x 10 RTB thighs Standing hip extension 2 x 10 RTB thighs Massage gun to R ITB and quads Stairs: reciprocal pattern full flight  09/08/23 Nustep L6x35min UE/LE Supine ITB stretch 2x30 w/ strap Supine hamstring stretch 2x30 w/  strap Supine quad stretch 2x30 w/ strap Seated LAQ x 10 Massage gun to R ITB and quads  09/01/23 Bike pain Nustep L3x40min  Seated tibia on femur PA and AP mobs grade II for pain Patellar glides medial  IASTM to R ITB and VL closer to knee  Supine ITB stretch with strap 3x15  Supine hamstring stretch with strap 2x30  08/25/23 See HEP below    PATIENT EDUCATION:  Education details: HEP updates, self massage, dry needling, cupping Person educated: Patient Education method: Explanation, Demonstration, and Handouts Education comprehension: verbalized understanding, returned demonstration, and needs further education  HOME EXERCISE PROGRAM: Access Code: [email or web address] URL: [email or web address] Date: 09/16/2023 Prepared by: Braylin Clark  Exercises - Modified Thomas Stretch  - 1 x daily - 7 x weekly - 2 sets - 1-2 min hold - Standing Hip Extension with Leg Bent and Support  - 1 x daily - 7 x weekly - 2 sets - 10 reps - Supine Hamstring Stretch with Strap  - 1 x daily - 7 x weekly - 2 sets - 2 reps - 30 sec hold - Supine ITB Stretch with Strap  - 1 x daily - 7 x weekly - 2 sets - 2 reps - 15-30 sec hold - Supine Quadriceps Stretch with Strap on Table  - 1 x daily - 7 x weekly - 2 sets - 2 reps - 30 sec hold - Seated Long Arc Quad  - 1 x daily - 3 x weekly - 2 sets - 10 reps - Clamshell with Resistance  - 1 x daily - 3 x weekly - 2 sets - 10  reps   ASSESSMENT:  CLINICAL IMPRESSION: Treatment session focused on updating pt's HEP. Working on progressing pt's quad and hip strengthening. Able to tolerate quad set which she was unable to perform on eval. Much improved ROM from evaluation and pt is now walking without crutch. Functionally, besides her increased range, she has not gained enough strength to feel improvements with her heavy duty tasks with the house reconstruction and home/gardening work. Will be getting gel injections which will hopefully provide more comfort to  perform her strengthening with less pain. Pt will greatly benefit from continued PT to further progress her R knee strength and stability. She has met or partially met 2 of her 6 LTGs with potential to meet all goals.   OBJECTIVE IMPAIRMENTS: Abnormal gait, decreased activity tolerance, decreased balance, decreased endurance, decreased mobility, difficulty walking, decreased ROM, decreased strength, increased edema, increased fascial restrictions, increased muscle spasms, improper body mechanics, postural dysfunction, and pain.   ACTIVITY LIMITATIONS: carrying, lifting, sitting, standing, squatting, sleeping, stairs, transfers, bed mobility, bathing, toileting, dressing, hygiene/grooming, locomotion level, and caring for others  PARTICIPATION LIMITATIONS: meal prep, cleaning, laundry, driving, shopping, community activity, and yard work  PERSONAL FACTORS: Age, Fitness, Past/current experiences, and Time since onset of injury/illness/exacerbation are also affecting patient's functional outcome.   REHAB POTENTIAL: Good  CLINICAL DECISION MAKING: Evolving/moderate complexity  EVALUATION COMPLEXITY: Moderate   GOALS: Goals reviewed with patient? Yes  SHORT TERM GOALS: Target date: 09/22/2023  Pt will be ind with initial HEP Baseline: Goal status: MET- 10/05/23  2.  Pt will demo improved R knee ROM from 5-110 deg for amb Baseline:  Goal status: MET- 09/16/23   LONG TERM GOALS: NEW Target date: 12/01/2023   Pt will be ind with management and progression of HEP Baseline:  Goal status: 10/20/23 PROGRESSING  2.  Pt will have improved knee ROM from 0 to 120 deg for stair negotiation Baseline:  10/20/23: 5 to 125 deg  Goal status: PARTIALLY MET  3.  Pt will be able to ascend/descend stairs in normal reciprocal pattern for improved home mobility Baseline:  10/05/23 see treatment 10/20/23: reciprocal pattern going up, fatigues and lacks control with descent but able to perform reciprocally Goal  status: IN PROGRESS  4.  Pt will be able to walk independently x 1000' to access community Baseline:  10/20/23: Gets stiff with walking but otherwise able to perform without crutches Goal status: MET  5.  Pt will demo 5 x STS of </=13 sec for decreased fall risk Baseline:  10/13/23: 22 sec Goal status: IN PROGRESS  6.  Pt will have improved LEFS to >/=48/80 to demo MCID Baseline: LEFS: 39/80 = 48.7% 10/20/23: Lower Extremity Functional Score: 37 / 80 = 46.3 % Goal status: IN PROGRESS   PLAN:  PT FREQUENCY: 1-2x/week  PT DURATION: 6 weeks  PLANNED INTERVENTIONS: 97164- PT Re-evaluation, 97750- Physical Performance Testing, 97110-Therapeutic exercises, 97530- Therapeutic activity, [id number]- Neuromuscular re-education, 97535- Self Care, [id number]- Manual therapy, [id number]- Gait training, [phone]- Aquatic Therapy, [phone]- Electrical stimulation (unattended), [phone]- Ionotophoresis 4mg /ml Dexamethasone, Patient/Family education, Balance training, Stair training, Taping, Dry Needling, Joint mobilization, Spinal mobilization, Cryotherapy, and Moist heat  PLAN FOR NEXT SESSION: Eccentric quad strength/control. Gross quad, hamstring and hip abductor/extensor progressive strengthening.    Wandy Bossler April Ma L Monesha Monreal, PT, DPT 10/20/2023, 4:23 PM

## 2023-10-25 ENCOUNTER — Ambulatory Visit (INDEPENDENT_AMBULATORY_CARE_PROVIDER_SITE_OTHER): Admitting: Sports Medicine

## 2023-10-25 ENCOUNTER — Encounter: Payer: Self-pay | Admitting: Sports Medicine

## 2023-10-25 ENCOUNTER — Other Ambulatory Visit (INDEPENDENT_AMBULATORY_CARE_PROVIDER_SITE_OTHER)

## 2023-10-25 ENCOUNTER — Telehealth: Payer: Self-pay | Admitting: Physician Assistant

## 2023-10-25 DIAGNOSIS — E785 Hyperlipidemia, unspecified: Secondary | ICD-10-CM

## 2023-10-25 DIAGNOSIS — Z0000 Encounter for general adult medical examination without abnormal findings: Secondary | ICD-10-CM

## 2023-10-25 DIAGNOSIS — M170 Bilateral primary osteoarthritis of knee: Secondary | ICD-10-CM

## 2023-10-25 DIAGNOSIS — Z131 Encounter for screening for diabetes mellitus: Secondary | ICD-10-CM

## 2023-10-25 DIAGNOSIS — E559 Vitamin D deficiency, unspecified: Secondary | ICD-10-CM

## 2023-10-25 DIAGNOSIS — E063 Autoimmune thyroiditis: Secondary | ICD-10-CM

## 2023-10-25 DIAGNOSIS — Z13220 Encounter for screening for lipoid disorders: Secondary | ICD-10-CM

## 2023-10-25 NOTE — Assessment & Plan Note (Signed)
 Pleasant 58 year old female, bilateral knee osteoarthritis, MRI showing degenerative meniscal fraying, dominant finding chondromalacia with areas of full-thickness cartilage loss, she had an injection back in April. She has had formal PT, still has some pain so we got her approved for viscosupplementation, today we did Euflexxa No. 1 of 3 both knees, return in 1 week for #2 of 3.

## 2023-10-25 NOTE — Telephone Encounter (Signed)
 Patient wants to gt bloodwork before appointment  for physical on 8/19.

## 2023-10-25 NOTE — Addendum Note (Signed)
 Addended by: OLIVA-AVELLANEDA, Yola Paradiso L on: 10/25/2023 09:13 AM   Modules accepted: Orders

## 2023-10-25 NOTE — Progress Notes (Signed)
    Procedures performed today:    Procedure: Real-time Ultrasound Guided aspiration/injection of the right knee Device: Samsung HS60  Verbal informed consent obtained.  Time-out conducted.  Noted no overlying erythema, induration, or other signs of local infection.  Skin prepped in a sterile fashion.  Local anesthesia: Topical Ethyl chloride.  With sterile technique and under real time ultrasound guidance: Moderate effusion noted, I aspirated 35 mL of clear, straw-colored fluid, syringe switched and 1 syringe of Euflexxa injected into the suprapatellar recess with an 18-gauge needle. Completed without difficulty  Advised to call if fevers/chills, erythema, induration, drainage, or persistent bleeding.  Images permanently stored and available for review in PACS.  Impression: Technically successful ultrasound guided injection.  Procedure: Real-time Ultrasound Guided injection of the left knee Device: Samsung HS60  Verbal informed consent obtained.  Time-out conducted.  Noted no overlying erythema, induration, or other signs of local infection.  Skin prepped in a sterile fashion.  Local anesthesia: Topical Ethyl chloride.  With sterile technique and under real time ultrasound guidance: No effusion noted, 1 syringe of Euflexxa injected into the suprapatellar recess with a 22-gauge needle. Completed without difficulty  Advised to call if fevers/chills, erythema, induration, drainage, or persistent bleeding.  Images permanently stored and available for review in PACS.  Impression: Technically successful ultrasound guided injection.  Independent interpretation of notes and tests performed by another provider:   None.  Brief History, Exam, Impression, and Recommendations:    Primary osteoarthritis of both knees Pleasant 58 year old female, bilateral knee osteoarthritis, MRI showing degenerative meniscal fraying, dominant finding chondromalacia with areas of full-thickness cartilage  loss, she had an injection back in April. She has had formal PT, still has some pain so we got her approved for viscosupplementation, today we did Euflexxa No. 1 of 3 both knees, return in 1 week for #2 of 3.    ____________________________________________ Debby PARAS. Curtis, M.D., ABFM., CAQSM., AME. Primary Care and Sports Medicine [facility]  Adjunct Professor of [facility] Medicine  [employer]  Restaurant manager, fast food

## 2023-10-25 NOTE — Telephone Encounter (Signed)
 Ordered labs come anytime but make sure fasting for at least 8hrs before coming in to have drawn.

## 2023-11-01 ENCOUNTER — Other Ambulatory Visit (INDEPENDENT_AMBULATORY_CARE_PROVIDER_SITE_OTHER)

## 2023-11-01 ENCOUNTER — Ambulatory Visit

## 2023-11-01 ENCOUNTER — Encounter: Payer: Self-pay | Admitting: Sports Medicine

## 2023-11-01 ENCOUNTER — Ambulatory Visit (INDEPENDENT_AMBULATORY_CARE_PROVIDER_SITE_OTHER): Admitting: Sports Medicine

## 2023-11-01 DIAGNOSIS — M170 Bilateral primary osteoarthritis of knee: Secondary | ICD-10-CM

## 2023-11-01 NOTE — Assessment & Plan Note (Signed)
 58 year old female with bilateral knee osteoarthritis, MRI did show degenerative meniscal tearing, dominant finding was chondromalacia with areas of full-thickness cartilage loss, she had a steroid injection in April, she had formal PT, today we did Euflexxa No. 2 of 3 both knees, return in 1 week for #3 of 3. Of note she is now pain-free in her left knee.

## 2023-11-01 NOTE — Addendum Note (Signed)
 Addended by: OLEY CHIQUITA CROME on: 11/01/2023 02:21 PM   Modules accepted: Orders

## 2023-11-01 NOTE — Progress Notes (Signed)
    Procedures performed today:    Procedure: Real-time Ultrasound Guided aspiration/injection of the right knee Device: Samsung HS60  Verbal informed consent obtained.  Time-out conducted.  Noted no overlying erythema, induration, or other signs of local infection.  Skin prepped in a sterile fashion.  Local anesthesia: Topical Ethyl chloride.  With sterile technique and under real time ultrasound guidance: Moderate effusion noted, I aspirated 24 mL of clear, straw-colored fluid, syringe switched and 1 syringe of Euflexxa injected into the suprapatellar recess with an 18-gauge needle. Completed without difficulty  Advised to call if fevers/chills, erythema, induration, drainage, or persistent bleeding.  Images permanently stored and available for review in PACS.  Impression: Technically successful ultrasound guided injection.   Procedure: Real-time Ultrasound Guided injection of the left knee Device: Samsung HS60  Verbal informed consent obtained.  Time-out conducted.  Noted no overlying erythema, induration, or other signs of local infection.  Skin prepped in a sterile fashion.  Local anesthesia: Topical Ethyl chloride.  With sterile technique and under real time ultrasound guidance: No effusion noted, 1 syringe of Euflexxa injected into the suprapatellar recess with a 22-gauge needle. Completed without difficulty  Advised to call if fevers/chills, erythema, induration, drainage, or persistent bleeding.  Images permanently stored and available for review in PACS.  Impression: Technically successful ultrasound guided injection.  Independent interpretation of notes and tests performed by another provider:   None.  Brief History, Exam, Impression, and Recommendations:    Primary osteoarthritis of both knees 58 year old female with bilateral knee osteoarthritis, MRI did show degenerative meniscal tearing, dominant finding was chondromalacia with areas of full-thickness cartilage  loss, she had a steroid injection in April, she had formal PT, today we did Euflexxa No. 2 of 3 both knees, return in 1 week for #3 of 3. Of note she is now pain-free in her left knee.    ____________________________________________ Debby PARAS. Curtis, M.D., ABFM., CAQSM., AME. Primary Care and Sports Medicine [facility]  Adjunct Professor of [facility] Medicine  [employer]  Restaurant manager, fast food

## 2023-11-08 ENCOUNTER — Ambulatory Visit (INDEPENDENT_AMBULATORY_CARE_PROVIDER_SITE_OTHER): Admitting: Sports Medicine

## 2023-11-08 ENCOUNTER — Other Ambulatory Visit (INDEPENDENT_AMBULATORY_CARE_PROVIDER_SITE_OTHER)

## 2023-11-08 DIAGNOSIS — E785 Hyperlipidemia, unspecified: Secondary | ICD-10-CM | POA: Diagnosis not present

## 2023-11-08 DIAGNOSIS — M170 Bilateral primary osteoarthritis of knee: Secondary | ICD-10-CM

## 2023-11-08 DIAGNOSIS — R7309 Other abnormal glucose: Secondary | ICD-10-CM | POA: Diagnosis not present

## 2023-11-08 DIAGNOSIS — E559 Vitamin D deficiency, unspecified: Secondary | ICD-10-CM | POA: Diagnosis not present

## 2023-11-08 DIAGNOSIS — E063 Autoimmune thyroiditis: Secondary | ICD-10-CM | POA: Diagnosis not present

## 2023-11-08 DIAGNOSIS — Z13220 Encounter for screening for lipoid disorders: Secondary | ICD-10-CM | POA: Diagnosis not present

## 2023-11-08 DIAGNOSIS — Z131 Encounter for screening for diabetes mellitus: Secondary | ICD-10-CM | POA: Diagnosis not present

## 2023-11-08 DIAGNOSIS — Z0000 Encounter for general adult medical examination without abnormal findings: Secondary | ICD-10-CM | POA: Diagnosis not present

## 2023-11-08 NOTE — Progress Notes (Signed)
    Procedures performed today:    Procedure: Real-time Ultrasound Guided aspiration/injection of the right knee Device: Samsung HS60  Verbal informed consent obtained.  Time-out conducted.  Noted no overlying erythema, induration, or other signs of local infection.  Skin prepped in a sterile fashion.  Local anesthesia: Topical Ethyl chloride.  With sterile technique and under real time ultrasound guidance: Moderate effusion noted, I aspirated 24 mL of clear, straw-colored fluid, syringe switched and 1 syringe of Euflexxa injected into the suprapatellar recess with an 18-gauge needle. Completed without difficulty  Advised to call if fevers/chills, erythema, induration, drainage, or persistent bleeding.  Images permanently stored and available for review in PACS.  Impression: Technically successful ultrasound guided injection.   Procedure: Real-time Ultrasound Guided injection of the left knee Device: Samsung HS60  Verbal informed consent obtained.  Time-out conducted.  Noted no overlying erythema, induration, or other signs of local infection.  Skin prepped in a sterile fashion.  Local anesthesia: Topical Ethyl chloride.  With sterile technique and under real time ultrasound guidance: No effusion noted, 1 syringe of Euflexxa injected into the suprapatellar recess with a 22-gauge needle. Completed without difficulty  Advised to call if fevers/chills, erythema, induration, drainage, or persistent bleeding.  Images permanently stored and available for review in PACS.  Impression: Technically successful ultrasound guided injection.  Independent interpretation of notes and tests performed by another provider:   None.  Brief History, Exam, Impression, and Recommendations:    Primary osteoarthritis of both knees 58 year old female, bilateral knee osteoarthritis, MRI with degenerative meniscal tearing, chondromalacia with areas of full-thickness cartilage loss, steroid injection was done  in April, formal PT, Euflexxa 3 of 3 today both knees, overall doing well, return in 6 weeks as needed.    ____________________________________________ Debby PARAS. Curtis, M.D., ABFM., CAQSM., AME. Primary Care and Sports Medicine [facility]  Adjunct Professor of [facility] Medicine  [employer]  Restaurant manager, fast food

## 2023-11-08 NOTE — Assessment & Plan Note (Signed)
 58 year old female, bilateral knee osteoarthritis, MRI with degenerative meniscal tearing, chondromalacia with areas of full-thickness cartilage loss, steroid injection was done in April, formal PT, Euflexxa 3 of 3 today both knees, overall doing well, return in 6 weeks as needed.

## 2023-11-09 ENCOUNTER — Ambulatory Visit: Payer: Self-pay | Admitting: Physician Assistant

## 2023-11-09 LAB — CBC WITH DIFFERENTIAL/PLATELET
Basophils Absolute: 0.1 x10E3/uL (ref 0.0–0.2)
Basos: 1 %
EOS (ABSOLUTE): 0.2 x10E3/uL (ref 0.0–0.4)
Eos: 3 %
Hematocrit: 41.2 % (ref 34.0–46.6)
Hemoglobin: 13 g/dL (ref 11.1–15.9)
Immature Grans (Abs): 0 x10E3/uL (ref 0.0–0.1)
Immature Granulocytes: 0 %
Lymphocytes Absolute: 2.7 x10E3/uL (ref 0.7–3.1)
Lymphs: 32 %
MCH: 28.7 pg (ref 26.6–33.0)
MCHC: 31.6 g/dL (ref 31.5–35.7)
MCV: 91 fL (ref 79–97)
Monocytes Absolute: 0.7 x10E3/uL (ref 0.1–0.9)
Monocytes: 9 %
Neutrophils Absolute: 4.7 x10E3/uL (ref 1.4–7.0)
Neutrophils: 55 %
Platelets: 292 x10E3/uL (ref 150–450)
RBC: 4.53 x10E6/uL (ref 3.77–5.28)
RDW: 13.3 % (ref 11.7–15.4)
WBC: 8.4 x10E3/uL (ref 3.4–10.8)

## 2023-11-09 LAB — LIPID PANEL
Chol/HDL Ratio: 7.2 ratio — ABNORMAL HIGH (ref 0.0–4.4)
Cholesterol, Total: 265 mg/dL — ABNORMAL HIGH (ref 100–199)
HDL: 37 mg/dL — ABNORMAL LOW (ref >39)
LDL Chol Calc (NIH): 191 mg/dL — ABNORMAL HIGH (ref 0–99)
Triglycerides: 196 mg/dL — ABNORMAL HIGH (ref 0–149)
VLDL Cholesterol Cal: 37 mg/dL (ref 5–40)

## 2023-11-09 LAB — CMP14+EGFR
ALT: 18 IU/L (ref 0–32)
AST: 19 IU/L (ref 0–40)
Albumin: 4.1 g/dL (ref 3.8–4.9)
Alkaline Phosphatase: 82 IU/L (ref 44–121)
BUN/Creatinine Ratio: 15 (ref 9–23)
BUN: 11 mg/dL (ref 6–24)
Bilirubin Total: <0.2 mg/dL (ref 0.0–1.2)
CO2: 18 mmol/L — ABNORMAL LOW (ref 20–29)
Calcium: 9.4 mg/dL (ref 8.7–10.2)
Chloride: 105 mmol/L (ref 96–106)
Creatinine, Ser: 0.71 mg/dL (ref 0.57–1.00)
Globulin, Total: 2.9 g/dL (ref 1.5–4.5)
Glucose: 107 mg/dL — ABNORMAL HIGH (ref 70–99)
Potassium: 5.1 mmol/L (ref 3.5–5.2)
Sodium: 140 mmol/L (ref 134–144)
Total Protein: 7 g/dL (ref 6.0–8.5)
eGFR: 98 mL/min/1.73 (ref >59)

## 2023-11-09 LAB — TSH+FREE T4
Free T4: 1.01 ng/dL (ref 0.82–1.77)
TSH: 4.99 u[IU]/mL — ABNORMAL HIGH (ref 0.450–4.500)

## 2023-11-09 LAB — VITAMIN D 25 HYDROXY (VIT D DEFICIENCY, FRACTURES): Vit D, 25-Hydroxy: 38.4 ng/mL (ref 30.0–100.0)

## 2023-11-09 NOTE — Progress Notes (Signed)
 Katrina Clements,   I know you have upcoming appt so we can talk about labs then.   Cholesterol is very elevated.   Katrina Clements.The 10-year ASCVD risk score (Arnett DK, et al., 2019) is: 4.8%   Values used to calculate the score:     Age: 58 years     Clincally relevant sex: Female     Is Non-Hispanic African American: No     Diabetic: No     Tobacco smoker: No     Systolic Blood Pressure: 130 mmHg     Is BP treated: No     HDL Cholesterol: 37 mg/dL     Total Cholesterol: 265 mg/dL  We need to add J8R to evaluate for diabetes due to glucose elevation.   Liver enzymes look good.  TSH is up some but T4 looks good. We can discuss at appt.  Vitamin D  in normal range but lower than the past.

## 2023-11-11 LAB — SPECIMEN STATUS REPORT

## 2023-11-11 LAB — HGB A1C W/O EAG: Hgb A1c MFr Bld: 6.1 — AB (ref 4.8–5.6)

## 2023-11-12 NOTE — Progress Notes (Signed)
 Pre-diabetes range. Will discuss at upcoming appt.

## 2023-11-15 ENCOUNTER — Ambulatory Visit: Admitting: Rehabilitation

## 2023-11-15 ENCOUNTER — Encounter: Payer: Self-pay | Admitting: Rehabilitation

## 2023-11-15 DIAGNOSIS — M25551 Pain in right hip: Secondary | ICD-10-CM | POA: Diagnosis not present

## 2023-11-15 DIAGNOSIS — M25661 Stiffness of right knee, not elsewhere classified: Secondary | ICD-10-CM

## 2023-11-15 DIAGNOSIS — G8929 Other chronic pain: Secondary | ICD-10-CM | POA: Diagnosis not present

## 2023-11-15 DIAGNOSIS — R2689 Other abnormalities of gait and mobility: Secondary | ICD-10-CM

## 2023-11-15 DIAGNOSIS — M6281 Muscle weakness (generalized): Secondary | ICD-10-CM

## 2023-11-15 DIAGNOSIS — M25561 Pain in right knee: Secondary | ICD-10-CM | POA: Diagnosis not present

## 2023-11-15 NOTE — Therapy (Signed)
 OUTPATIENT PHYSICAL THERAPY LOWER EXTREMITY TREATMENT AND RE-CERT   Patient Name: Katrina Clements MRN: [id number] DOB:27-Jul-1965, 58 y.o., female Today's Date: 11/15/2023  END OF SESSION:  PT End of Session - 11/15/23 0758     Visit Number 10    PT Start Time 0759    PT Stop Time 0844    PT Time Calculation (min) 45 min    Activity Tolerance Patient tolerated treatment well    Behavior During Therapy [facility] for tasks assessed/performed            Past Medical History:  Diagnosis Date   Adenomyosis    Allergy    seasonal   Endometriosis    Hyperlipidemia    Thyroid  disease    Past Surgical History:  Procedure Laterality Date   BLADDER SUSPENSION     btl     LAPAROSCOPY     x 2   VAGINAL HYSTERECTOMY     total   Patient Active Problem List   Diagnosis Date Noted   Primary osteoarthritis of both knees 07/06/2023   Trochanteric bursitis of both hips 12/04/2022   Hives 12/04/2022   Adenomyosis 10/24/2020   Allergy 10/24/2020   Endometriosis 10/24/2020   Thyroid  disease 10/24/2020   Abnormal stress ECG 08/05/2020   Hypertension 08/05/2020   Obesity (BMI 30-39.9) 08/05/2020   Chest pain 06/05/2020   Family history of heart attack 06/05/2020   Arthralgia 12/08/2019   Class 1 obesity due to excess calories without serious comorbidity with body mass index (BMI) of 34.0 to 34.9 in adult 12/08/2019   Bilateral carpal tunnel syndrome 11/28/2018   Vitamin D  deficiency 10/10/2018   Hypocalcemia 10/10/2018   Numbness and tingling in right hand 10/10/2018   Cervical herniated disc 04/06/2018   Dyslipidemia (high LDL; low HDL) 08/27/2016   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/01/2016   Hypothyroidism due to Hashimoto's thyroiditis 12/08/2013   Hyperlipidemia 12/08/2013   Fibromyalgia 12/08/2013    PCP: Antoniette Vermell CROME, PA-C  REFERRING PROVIDER: Curtis Debby PARAS, MD  REFERRING DIAG: M17.11 (ICD-10-CM) - Primary osteoarthritis of right knee  THERAPY DIAG:  Muscle weakness  (generalized)  Chronic pain of right knee  Other abnormalities of gait and mobility  Stiffness of right knee, not elsewhere classified  Rationale for Evaluation and Treatment: Rehabilitation  ONSET DATE: ~3 weeks ago exacerbation, chronic pain in knee since Dec  SUBJECTIVE:   SUBJECTIVE STATEMENT: Pt. Reports feels ok.  Has been working on rebuilding/restoring a farmhouse and is sore in the R knee from doing all of this.  States went to the [employer] store and got a custom orthotics for both feet.  States she is having pain in her R heel now and is going back to see if they will fix the orthotics. States the heel pain is worse than the R knee  PERTINENT HISTORY: N/a  PAIN:  Are you having pain? Yes: NPRS scale: 2 today, at worst >10 Pain location: R medial anterior knee (adductor insertion), lateral quad Pain description: throbs Aggravating factors: Walking, stairs, transfers Relieving factors: Propped and bent,   PRECAUTIONS: None  RED FLAGS: None   WEIGHT BEARING RESTRICTIONS: No  FALLS:  Has patient fallen in last 6 months? Yes. 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  DIAGNOSTIC FINDINGS: MRI did show some degenerative meniscal fraying, dominant finding on my interpretation was significant chondromalacia and some areas near full-thickness under the patella. Pain is predominately patellofemoral today.  PATIENT SURVEYS:  LEFS: 39/80 =  48.7%  COGNITION: Overall cognitive status: Within functional limits for tasks assessed     SENSATION: WFL  EDEMA:  Circumferential: 41 on L, 45 on R  MUSCLE LENGTH: Hamstrings: WNL  POSTURE: No Significant postural limitations  PALPATION: Highly tender to palpation R quad, adductors and ITB  LOWER EXTREMITY ROM:  Active ROM Right eval Left eval R 09/16/23 R 10/20/23  Hip flexion      Hip extension      Hip abduction      Hip adduction      Hip internal rotation      Hip external rotation      Knee flexion 105 * 125 128 125 AROM  Knee extension -13* 0 4 5 PROM 10 AROM   Ankle dorsiflexion      Ankle plantarflexion      Ankle inversion      Ankle eversion       (Blank rows = not tested, * = pain)  LOWER EXTREMITY MMT:  MMT Right eval Left eval R/L 09/29/23  Hip flexion 5 4+ 5/5  Hip extension 3+ 5 4-/4  Hip abduction 3+ 3+ 4-/4  Hip adduction     Hip internal rotation     Hip external rotation     Knee flexion 3+ * 5 4-/5  Knee extension 5 5 5/5  Ankle dorsiflexion     Ankle plantarflexion     Ankle inversion     Ankle eversion      (Blank rows = not tested, * = pain)  LOWER EXTREMITY SPECIAL TESTS:  Hip special tests: Debby test: negative and Ober's test: positive   FUNCTIONAL TESTS: (deferred due to pain eval day) 5 times sit to stand: 22 sec  SLS: TBA   GAIT: Distance walked: Into clinic Assistive device utilized: Crutches 1 crutch on R initially Level of assistance: Modified independence Comments: Antalgic, decreased knee extension and weightbearing on R. Cues to use crutch on L                                                                                                                                TREATMENT DATE:  11/15/23 THERAPEUTIC EXERCISE: To improve strength.  Demonstration, verbal and tactile cues throughout for technique. NuStep L5 x 6'  THERAPEUTIC ACTIVITIES: To improve functional performance.  Demonstration, verbal and  tactile cues throughout for technique. Standing calf stretch knee straight 1' x 2 RLE Standing soleus stretch knee  bent 1' x 2 RLE RTB ankle inversion x 20 RLE RTB ankle eversion x 20 RLE  SELF CARE: Provided education on proper foot position with gait, night splints types and wear times; orthotic change recommendations of full length orthotic w/ forefoot valgus posting.  MANUAL THERAPY: To promote reduced pain utilizing kinesiotaping. R foot:  fan tape for p. Fascia; posterior tibialis tape 1 strip for pronation cue/correct  10/20/23 Nustep L5 x 6 min Standing calf stretch x 30 Seated hip flexor stretch x 30 Seated quad set x10 Seated quad set + SLR with strap 2x5 Step up 6 step 2x5 Step downs 6 step x5 Side step downs 6 x5 Side step red TB x4 laps by counter Fwd and bwd monster walk red TB around ankles x4 laps by counter Rechecked goals  10/13/23 Nustep L6x72min US  to R knee over meniscus 1 MHz, 1.2 W/cm2 x 8 min Knee joint mobs grade II-III PA and AP supine PROM stretching for R knee flex and ext 5xSTS- 22 sec KT tape chondromalacia pattern  10/05/23 THERAPEUTIC EXERCISE: To improve strength, endurance, ROM, and flexibility. Demonstration, verbal and tactile cues throughout for technique.  Nustep L5x6min UE/LE Seated LAQ L GTB + ball squeeze x 10; no ball squeeze on R x 10, 2x5 w/ ball squeeze NEUROMUSCULAR RE-EDUCATION: To improve coordination, kinesthesia, posture, and proprioception.  KT tape to R knee I strips along each side of patella for stabilization Mini squat 5x Standing 4 way hip strengthening GTB anchored to furniture x 10 B  Single leg deadlift x 10 R/L; x 10 standing on airex R/L THERAPEUTIC ACTIVITIES: To improve functional performance.  Stairs: 14 ascending/descending- decreased eccentric control R knee  09/29/23 Nustep L5 x 6 min UE/LE MMT Seated R LAQ 2lb 2x10 Seated Hamstring curls GTB 2x10 Standing hip abduction,extension, add and flexion RTB  anchored to furniture and around ankle  Single leg deadlift with UE support x 10  09/22/23 Nustep L5 x 5 min UEs/LEs Massage gun to R ITB, glute med, piriformis and quads Supine ITB stretch with strap x30 Supine piriformis stretch x30 S/L clamshell RTB 2x10 Bridge RTB 2x10 Seated LAQ red TB 2x10 K tape 2 I strips for improved patellar tracking (see HEP below)  09/16/23 Nustep L6x91min UE/LE Supine hip ADD ball squeeze x 10 Suine hip ADD + knee ext x 10 S/L clamshell RTB x 10 B Standing hip abduction 2 x 10 RTB thighs Standing hip extension 2 x 10 RTB thighs Massage gun to R ITB and quads Stairs: reciprocal pattern full flight  09/08/23 Nustep L6x19min UE/LE Supine ITB stretch 2x30 w/ strap Supine hamstring stretch 2x30 w/ strap Supine quad stretch 2x30 w/ strap Seated LAQ x 10 Massage gun to R ITB and quads  09/01/23 Bike pain Nustep L3x32min  Seated tibia on femur PA and AP mobs grade II for pain Patellar glides medial  IASTM to R ITB and VL closer to knee  Supine ITB stretch with strap 3x15  Supine hamstring stretch with strap 2x30  08/25/23 See HEP below    PATIENT EDUCATION:  Education details: HEP updates, self massage, dry needling, cupping Person educated: Patient Education method: Explanation, Demonstration, and Handouts Education comprehension: verbalized understanding, returned demonstration, and needs further education  HOME EXERCISE PROGRAM: Access Code: [email or web address] URL: [email or web address] Date: 09/16/2023 Prepared by: Braylin Clark  Exercises - Modified Thomas Stretch  - 1 x daily - 7 x weekly - 2 sets - 1-2 min hold - Standing Hip Extension with Leg Bent  and Support  - 1 x daily - 7 x weekly - 2 sets - 10 reps - Supine Hamstring Stretch with Strap  - 1 x daily - 7 x weekly - 2 sets - 2 reps - 30 sec hold - Supine ITB Stretch with Strap  - 1 x daily - 7 x weekly - 2 sets - 2 reps - 15-30 sec hold - Supine Quadriceps Stretch with  Strap on Table  - 1 x daily - 7 x weekly - 2 sets - 2 reps - 30 sec hold - Seated Long Arc Quad  - 1 x daily - 3 x weekly - 2 sets - 10 reps - Clamshell with Resistance  - 1 x daily - 3 x weekly - 2 sets - 10 reps   ASSESSMENT:  CLINICAL IMPRESSION: Patient is having a lot of R heel pain from a combination of R knee pain compensatory gait pattern along with new orthotics which are not correct for her.   She has a 3/4 length orthotic, but has a significant forefoot valgus and needs forefoot posting.  She needs to return to the place she received them and request full length orthotic with forefoot valgus posting.  Discussed ways to relieve p. Fasciitis and R heel pain and added some R foot/ankle exercises to her HEP.   She is appreciative of this and I think it will help her R knee pain as well to get proper foot support/leveling and ankle strengthening.    OBJECTIVE IMPAIRMENTS: Abnormal gait, decreased activity tolerance, decreased balance, decreased endurance, decreased mobility, difficulty walking, decreased ROM, decreased strength, increased edema, increased fascial restrictions, increased muscle spasms, improper body mechanics, postural dysfunction, and pain.   ACTIVITY LIMITATIONS: carrying, lifting, sitting, standing, squatting, sleeping, stairs, transfers, bed mobility, bathing, toileting, dressing, hygiene/grooming, locomotion level, and caring for others  PARTICIPATION LIMITATIONS: meal prep, cleaning, laundry, driving, shopping, community activity, and yard work  PERSONAL FACTORS: Age, Fitness, Past/current experiences, and Time since onset of injury/illness/exacerbation are also affecting patient's functional outcome.   REHAB POTENTIAL: Good  CLINICAL DECISION MAKING: Evolving/moderate complexity  EVALUATION COMPLEXITY: Moderate   GOALS: Goals reviewed with patient? Yes  SHORT TERM GOALS: Target date: 09/22/2023  Pt will be ind with initial HEP Baseline: Goal status: MET-  10/05/23  2.  Pt will demo improved R knee ROM from 5-110 deg for amb Baseline:  Goal status: MET- 09/16/23   LONG TERM GOALS: NEW Target date: 12/01/2023   Pt will be ind with management and progression of HEP Baseline:  Goal status: 10/20/23 PROGRESSING  2.  Pt will have improved knee ROM from 0 to 120 deg for stair negotiation Baseline:  10/20/23: 5 to 125 deg  Goal status: MET  3.  Pt will be able to ascend/descend stairs in normal reciprocal pattern for improved home mobility Baseline:  10/05/23 see treatment 10/20/23: reciprocal pattern going up, fatigues and lacks control with descent but able to perform reciprocally Goal status: IN PROGRESS  4.  Pt will be able to walk independently x 1000' to access community Baseline:  10/20/23: Gets stiff with walking but otherwise able to perform without crutches Goal status: MET  5.  Pt will demo 5 x STS of </=13 sec for decreased fall risk Baseline: 10/13/23: 22 sec 11/15/23:  16.33 SEC Goal status: IN PROGRESS   6.  Pt will have improved LEFS to >/=48/80 to demo MCID Baseline: LEFS: 39/80 = 48.7% 10/20/23: Lower Extremity Functional Score: 37 / 80 = 46.3 %  Goal status: IN PROGRESS   PLAN:  PT FREQUENCY: 1-2x/week  PT DURATION: 6 weeks  PLANNED INTERVENTIONS: [id number]- PT Re-evaluation, 97750- Physical Performance Testing, 97110-Therapeutic exercises, 97530- Therapeutic activity, [id number]- Neuromuscular re-education, 97535- Self Care, [id number]- Manual therapy, [phone]- Gait training, [phone]- Aquatic Therapy, [phone]- Electrical stimulation (unattended), [phone]- Ionotophoresis 4mg /ml Dexamethasone, Patient/Family education, Balance training, Stair training, Taping, Dry Needling, Joint mobilization, Spinal mobilization, Cryotherapy, and Moist heat  PLAN FOR NEXT SESSION: Reassess R heel pain; progress with Eccentric quad strength/control. Gross quad, hamstring and hip abductor/extensor progressive strengthening.    Raenah Murley, PT,  DPT 11/15/2023, 1:56 PM

## 2023-11-22 ENCOUNTER — Ambulatory Visit: Attending: Sports Medicine | Admitting: Rehabilitation

## 2023-11-22 DIAGNOSIS — M25561 Pain in right knee: Secondary | ICD-10-CM | POA: Insufficient documentation

## 2023-11-22 DIAGNOSIS — M6281 Muscle weakness (generalized): Secondary | ICD-10-CM | POA: Insufficient documentation

## 2023-11-22 DIAGNOSIS — M25661 Stiffness of right knee, not elsewhere classified: Secondary | ICD-10-CM | POA: Diagnosis present

## 2023-11-22 DIAGNOSIS — M25551 Pain in right hip: Secondary | ICD-10-CM | POA: Diagnosis present

## 2023-11-22 DIAGNOSIS — R2689 Other abnormalities of gait and mobility: Secondary | ICD-10-CM | POA: Insufficient documentation

## 2023-11-22 DIAGNOSIS — G8929 Other chronic pain: Secondary | ICD-10-CM | POA: Diagnosis present

## 2023-11-22 NOTE — Therapy (Signed)
 OUTPATIENT PHYSICAL THERAPY LOWER EXTREMITY TREATMENT    Patient Name: Katrina Clements MRN: [id number] DOB:March 18, 1966, 58 y.o., female Today's Date: 11/22/2023  END OF SESSION:  PT End of Session - 11/22/23 0930     Visit Number 11    Date for PT Re-Evaluation 12/01/23    Authorization Type [employer]    PT Start Time 0927    PT Stop Time 1017    PT Time Calculation (min) 50 min    Activity Tolerance Patient tolerated treatment well;Patient limited by fatigue    Behavior During Therapy [facility] for tasks assessed/performed            Past Medical History:  Diagnosis Date   Adenomyosis    Allergy    seasonal   Endometriosis    Hyperlipidemia    Thyroid  disease    Past Surgical History:  Procedure Laterality Date   BLADDER SUSPENSION     btl     LAPAROSCOPY     x 2   VAGINAL HYSTERECTOMY     total   Patient Active Problem List   Diagnosis Date Noted   Primary osteoarthritis of both knees 07/06/2023   Trochanteric bursitis of both hips 12/04/2022   Hives 12/04/2022   Adenomyosis 10/24/2020   Allergy 10/24/2020   Endometriosis 10/24/2020   Thyroid  disease 10/24/2020   Abnormal stress ECG 08/05/2020   Hypertension 08/05/2020   Obesity (BMI 30-39.9) 08/05/2020   Chest pain 06/05/2020   Family history of heart attack 06/05/2020   Arthralgia 12/08/2019   Class 1 obesity due to excess calories without serious comorbidity with body mass index (BMI) of 34.0 to 34.9 in adult 12/08/2019   Bilateral carpal tunnel syndrome 11/28/2018   Vitamin D  deficiency 10/10/2018   Hypocalcemia 10/10/2018   Numbness and tingling in right hand 10/10/2018   Cervical herniated disc 04/06/2018   Dyslipidemia (high LDL; low HDL) 08/27/2016   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/01/2016   Hypothyroidism due to Hashimoto's thyroiditis 12/08/2013   Hyperlipidemia 12/08/2013   Fibromyalgia 12/08/2013    PCP: Antoniette Vermell CROME, PA-C  REFERRING PROVIDER: Curtis Debby PARAS, MD  REFERRING DIAG: M17.11  (ICD-10-CM) - Primary osteoarthritis of right knee  THERAPY DIAG:  Muscle weakness (generalized)  Chronic pain of right knee  Other abnormalities of gait and mobility  Stiffness of right knee, not elsewhere classified  Pain in right hip  Rationale for Evaluation and Treatment: Rehabilitation  ONSET DATE: ~3 weeks ago exacerbation, chronic pain in knee since Dec  SUBJECTIVE:   SUBJECTIVE STATEMENT: States went on vacation to [place] this past weekend and did a lot of walking and stair climbing so she is a little more sore with her R knee.   Rates pain today 2/10.  States feels about 70% improved over initial visit.   States hasn't been back to get a new orthotic, but is using her old ones which are doing much better.  She is using KT tape on the R foot which is also helping.  She has minimal c/o R ankle pain.SABRA    PERTINENT HISTORY: N/a  PAIN:  Are you having pain? Yes: NPRS scale: 2 today, at worst >10 Pain location: R medial anterior knee (adductor insertion), lateral quad Pain description: throbs Aggravating factors: Walking, stairs, transfers Relieving factors: Propped and bent,   PRECAUTIONS: None  RED FLAGS: None   WEIGHT BEARING RESTRICTIONS: No  FALLS:  Has patient fallen in last 6 months? Yes. 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  DIAGNOSTIC FINDINGS: MRI did show some degenerative meniscal fraying, dominant finding on my  interpretation was significant chondromalacia and some areas near full-thickness under the patella. Pain is predominately patellofemoral today.  PATIENT SURVEYS:  LEFS: 39/80 = 48.7%  COGNITION: Overall cognitive status: Within functional limits for tasks assessed     SENSATION: WFL  EDEMA:  Circumferential: 41 on L, 45 on R  MUSCLE LENGTH: Hamstrings: WNL  POSTURE: No Significant postural limitations  PALPATION: Highly tender to palpation R quad, adductors and ITB  LOWER EXTREMITY ROM:  Active ROM Right eval Left eval R 09/16/23 R 10/20/23  Hip flexion      Hip extension      Hip abduction      Hip adduction      Hip internal rotation      Hip external rotation      Knee flexion 105 * 125 128 125 AROM  Knee extension -13* 0 4 5 PROM 10 AROM   Ankle dorsiflexion      Ankle plantarflexion      Ankle inversion      Ankle eversion       (Blank rows = not tested, * = pain)  LOWER EXTREMITY MMT:  MMT Right eval Left eval R/L 09/29/23  Hip flexion 5 4+ 5/5  Hip extension 3+ 5 4-/4  Hip abduction 3+ 3+ 4-/4  Hip adduction     Hip internal rotation     Hip external rotation     Knee flexion 3+ * 5 4-/5  Knee extension 5 5 5/5  Ankle dorsiflexion     Ankle plantarflexion     Ankle inversion     Ankle eversion      (Blank rows = not tested, * = pain)  LOWER EXTREMITY SPECIAL TESTS:  Hip special tests: Debby test: negative and Ober's test: positive   FUNCTIONAL TESTS: (deferred due to pain eval day) 5 times sit to stand: 22 sec  SLS: TBA   GAIT: Distance walked: Into clinic Assistive device utilized: Crutches 1 crutch on R initially Level of assistance: Modified independence Comments: Antalgic, decreased knee extension and weightbearing on R. Cues to use crutch on L                                                                                                                                TREATMENT DATE:  11/22/23 THERAPEUTIC EXERCISE: To improve  strength.  Demonstration, verbal and tactile  cues throughout for technique. NuStep L5 x 7'  THERAPEUTIC ACTIVITIES: To improve functional performance.  Demonstration, verbal and tactile cues throughout for technique. Standing calf stretch knee straight 1' x 2 RLE Standing soleus stretch knee bent 1' x 2 RLE Step ups 6 forward and lateral x 2/5 RLE Step downs 6 2/5 RLE Seated knee extension 25# x 2/10 RLE Seated knee flexion 25#  x 2/10 RLE  NEUROMUSCULAR RE-EDUCATION: To improve balance. Sidestepping Blue TB x 2 laps down/back gym floor Monster walks GTB x 2 laps down gym floor and back Standing IT band stretch x 1' RLE Standing foam to wall IT self massage RLE Roller self IT massage  MANUAL THERAPY: To promote reduced pain utilizing therapeutic massage and myofascial release. MFR and STM to R IT band mid band to insertion    11/15/23 THERAPEUTIC EXERCISE: To improve strength.  Demonstration, verbal and tactile cues throughout for technique. NuStep L5 x 6'  THERAPEUTIC ACTIVITIES: To improve functional performance.  Demonstration, verbal and tactile cues throughout for technique. Standing calf stretch knee straight 1' x 2 RLE Standing soleus stretch knee bent 1' x 2 RLE RTB ankle inversion x 20 RLE RTB ankle eversion x 20 RLE  SELF CARE: Provided education on proper foot position with gait, night splints types and wear times; orthotic change recommendations of full length orthotic w/ forefoot valgus posting.  MANUAL THERAPY: To promote reduced pain utilizing kinesiotaping. R foot:  fan tape for p. Fascia; posterior tibialis tape 1 strip for pronation cue/correct  10/20/23 Nustep L5 x 6 min Standing calf stretch x 30 Seated hip flexor stretch x 30 Seated quad set x10 Seated quad set + SLR with strap 2x5 Step up 6 step 2x5 Step downs 6 step x5 Side step downs 6 x5 Side step red TB x4 laps by counter Fwd and bwd monster walk red TB around ankles x4 laps by  counter Rechecked goals  10/13/23 Nustep L6x48min US  to R knee over meniscus 1 MHz, 1.2 W/cm2 x 8 min Knee joint mobs grade II-III PA and AP supine PROM stretching for R knee flex and ext 5xSTS- 22 sec KT tape chondromalacia pattern  10/05/23 THERAPEUTIC EXERCISE: To improve strength, endurance, ROM, and flexibility. Demonstration, verbal and tactile cues throughout for technique.  Nustep L5x6min UE/LE Seated LAQ L GTB + ball squeeze x 10; no ball squeeze on R x 10, 2x5 w/ ball squeeze NEUROMUSCULAR RE-EDUCATION: To improve coordination, kinesthesia, posture, and proprioception.  KT tape to R knee I strips along each side of patella for stabilization Mini squat 5x Standing 4 way hip strengthening GTB anchored to furniture x 10 B  Single leg deadlift x 10 R/L; x 10 standing on airex R/L THERAPEUTIC ACTIVITIES: To improve functional performance.  Stairs: 14 ascending/descending- decreased eccentric control R knee  09/29/23 Nustep L5 x 6 min UE/LE MMT Seated R LAQ 2lb 2x10 Seated Hamstring curls GTB 2x10 Standing hip abduction,extension, add and flexion RTB anchored to furniture and around ankle  Single leg deadlift with UE support x 10  09/22/23 Nustep L5 x 5 min UEs/LEs Massage gun to R ITB, glute med, piriformis and quads Supine ITB stretch with strap x30 Supine piriformis stretch x30 S/L clamshell RTB 2x10 Bridge RTB 2x10 Seated LAQ red TB 2x10 K tape 2 I strips for improved patellar tracking (see HEP below)  09/16/23 Nustep L6x94min UE/LE Supine hip ADD ball squeeze x 10 Suine hip ADD + knee ext x 10 S/L clamshell RTB x 10 B  Standing hip abduction 2 x 10 RTB thighs Standing hip extension 2 x 10 RTB thighs Massage gun to R ITB and quads Stairs: reciprocal pattern full flight  09/08/23 Nustep L6x66min UE/LE Supine ITB stretch 2x30 w/ strap Supine hamstring stretch 2x30 w/ strap Supine quad stretch 2x30 w/ strap Seated LAQ x 10 Massage gun to R ITB and  quads  09/01/23 Bike pain Nustep L3x10min  Seated tibia on femur PA and AP mobs grade II for pain Patellar glides medial  IASTM to R ITB and VL closer to knee  Supine ITB stretch with strap 3x15  Supine hamstring stretch with strap 2x30  08/25/23 See HEP below    PATIENT EDUCATION:  Education details: HEP updates, self massage, dry needling, cupping Person educated: Patient Education method: Explanation, Demonstration, and Handouts Education comprehension: verbalized understanding, returned demonstration, and needs further education  HOME EXERCISE PROGRAM: Access Code: [email or web address] URL: [email or web address] Date: 09/16/2023 Prepared by: Braylin Clark  Exercises - Modified Thomas Stretch  - 1 x daily - 7 x weekly - 2 sets - 1-2 min hold - Standing Hip Extension with Leg Bent and Support  - 1 x daily - 7 x weekly - 2 sets - 10 reps - Supine Hamstring Stretch with Strap  - 1 x daily - 7 x weekly - 2 sets - 2 reps - 30 sec hold - Supine ITB Stretch with Strap  - 1 x daily - 7 x weekly - 2 sets - 2 reps - 15-30 sec hold - Supine Quadriceps Stretch with Strap on Table  - 1 x daily - 7 x weekly - 2 sets - 2 reps - 30 sec hold - Seated Long Arc Quad  - 1 x daily - 3 x weekly - 2 sets - 10 reps - Clamshell with Resistance  - 1 x daily - 3 x weekly - 2 sets - 10 reps   ASSESSMENT:  CLINICAL IMPRESSION: Patient is progressing to near meeting her goals.  She has a little residual pain, but is returning to her normal activities now.    Discussed d/c in next 1-2 visits and she is agreeable to d/c on next visit.   That is the plan if no further flare ups of pain occur  OBJECTIVE IMPAIRMENTS: Abnormal gait, decreased activity tolerance, decreased balance, decreased endurance, decreased mobility, difficulty walking, decreased ROM, decreased strength, increased edema, increased fascial restrictions, increased muscle spasms, improper body mechanics, postural dysfunction, and pain.    ACTIVITY LIMITATIONS: carrying, lifting, sitting, standing, squatting, sleeping, stairs, transfers, bed mobility, bathing, toileting, dressing, hygiene/grooming, locomotion level, and caring for others  PARTICIPATION LIMITATIONS: meal prep, cleaning, laundry, driving, shopping, community activity, and yard work  PERSONAL FACTORS: Age, Fitness, Past/current experiences, and Time since onset of injury/illness/exacerbation are also affecting patient's functional outcome.   REHAB POTENTIAL: Good  CLINICAL DECISION MAKING: Evolving/moderate complexity  EVALUATION COMPLEXITY: Moderate   GOALS: Goals reviewed with patient? Yes  SHORT TERM GOALS: Target date: 09/22/2023  Pt will be ind with initial HEP Baseline: Goal status: MET- 10/05/23  2.  Pt will demo improved R knee ROM from 5-110 deg for amb Baseline:  Goal status: MET- 09/16/23   LONG TERM GOALS: NEW Target date: 12/01/2023   Pt will be ind with management and progression of HEP Baseline:  Goal status: 10/20/23 PROGRESSING  2.  Pt will have improved knee ROM from 0 to 120 deg for stair negotiation Baseline:  10/20/23: 5 to 125 deg  Goal status: MET  3.  Pt will be able to ascend/descend stairs in normal reciprocal pattern for improved home mobility Baseline:  10/05/23 see treatment 10/20/23: reciprocal pattern going up, fatigues and lacks control with descent but able to perform reciprocally Goal status: IN PROGRESS  4.  Pt will be able to walk independently x 1000' to access community Baseline:  10/20/23: Gets stiff with walking but otherwise able to perform without crutches Goal status: MET  5.  Pt will demo 5 x STS of </=13 sec for decreased fall risk Baseline: 10/13/23: 22 sec 11/15/23:  16.33 SEC Goal status: IN PROGRESS   6.  Pt will have improved LEFS to >/=48/80 to demo MCID Baseline: LEFS: 39/80 = 48.7% 10/20/23: Lower Extremity Functional Score: 37 / 80 = 46.3 % Goal status: IN PROGRESS   PLAN:  PT  FREQUENCY: 1-2x/week  PT DURATION: 6 weeks  PLANNED INTERVENTIONS: [id number]- PT Re-evaluation, 97750- Physical Performance Testing, 97110-Therapeutic exercises, 97530- Therapeutic activity, 97112- Neuromuscular re-education, 97535- Self Care, [id number]- Manual therapy, [id number]- Gait training, [phone]- Aquatic Therapy, [phone]- Electrical stimulation (unattended), [phone]- Ionotophoresis 4mg /ml Dexamethasone, Patient/Family education, Balance training, Stair training, Taping, Dry Needling, Joint mobilization, Spinal mobilization, Cryotherapy, and Moist heat  PLAN FOR NEXT SESSION:   Assess goals and plan for D/C  next visit if goals are met.    Jory Tanguma, PT, DPT 11/22/2023, 8:11 PM

## 2023-11-27 ENCOUNTER — Other Ambulatory Visit: Payer: Self-pay | Admitting: Physician Assistant

## 2023-11-27 DIAGNOSIS — E063 Autoimmune thyroiditis: Secondary | ICD-10-CM

## 2023-12-01 ENCOUNTER — Ambulatory Visit: Admitting: Rehabilitation

## 2023-12-01 DIAGNOSIS — M6281 Muscle weakness (generalized): Secondary | ICD-10-CM

## 2023-12-01 DIAGNOSIS — R2689 Other abnormalities of gait and mobility: Secondary | ICD-10-CM

## 2023-12-01 DIAGNOSIS — G8929 Other chronic pain: Secondary | ICD-10-CM | POA: Diagnosis not present

## 2023-12-01 DIAGNOSIS — M25561 Pain in right knee: Secondary | ICD-10-CM | POA: Diagnosis not present

## 2023-12-01 DIAGNOSIS — M25661 Stiffness of right knee, not elsewhere classified: Secondary | ICD-10-CM

## 2023-12-01 DIAGNOSIS — M25551 Pain in right hip: Secondary | ICD-10-CM | POA: Diagnosis not present

## 2023-12-01 NOTE — Therapy (Signed)
 OUTPATIENT PHYSICAL THERAPY LOWER EXTREMITY TREATMENT / DC SUMMARY   Patient Name: Katrina Clements MRN: [id number] DOB:November 30, 1965, 58 y.o., female Today's Date: 12/01/2023  END OF SESSION:  PT End of Session - 12/01/23 0852     Visit Number 12    Date for PT Re-Evaluation 12/01/23    Authorization Type BCBS    PT Start Time 0848    PT Stop Time 0930    PT Time Calculation (min) 42 min    Activity Tolerance Patient tolerated treatment well;Patient limited by fatigue    Behavior During Therapy [facility] for tasks assessed/performed            Past Medical History:  Diagnosis Date   Adenomyosis    Allergy    seasonal   Endometriosis    Hyperlipidemia    Thyroid  disease    Past Surgical History:  Procedure Laterality Date   BLADDER SUSPENSION     btl     LAPAROSCOPY     x 2   VAGINAL HYSTERECTOMY     total   Patient Active Problem List   Diagnosis Date Noted   Primary osteoarthritis of both knees 07/06/2023   Trochanteric bursitis of both hips 12/04/2022   Hives 12/04/2022   Adenomyosis 10/24/2020   Allergy 10/24/2020   Endometriosis 10/24/2020   Thyroid  disease 10/24/2020   Abnormal stress ECG 08/05/2020   Hypertension 08/05/2020   Obesity (BMI 30-39.9) 08/05/2020   Chest pain 06/05/2020   Family history of heart attack 06/05/2020   Arthralgia 12/08/2019   Class 1 obesity due to excess calories without serious comorbidity with body mass index (BMI) of 34.0 to 34.9 in adult 12/08/2019   Bilateral carpal tunnel syndrome 11/28/2018   Vitamin D  deficiency 10/10/2018   Hypocalcemia 10/10/2018   Numbness and tingling in right hand 10/10/2018   Cervical herniated disc 04/06/2018   Dyslipidemia (high LDL; low HDL) 08/27/2016   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/01/2016   Hypothyroidism due to Hashimoto's thyroiditis 12/08/2013   Hyperlipidemia 12/08/2013   Fibromyalgia 12/08/2013    PCP: Antoniette Vermell CROME, PA-C  REFERRING PROVIDER: Curtis Debby PARAS, MD  REFERRING DIAG:  M17.11 (ICD-10-CM) - Primary osteoarthritis of right knee  THERAPY DIAG:  Muscle weakness (generalized)  Chronic pain of right knee  Other abnormalities of gait and mobility  Stiffness of right knee, not elsewhere classified  Rationale for Evaluation and Treatment: Rehabilitation  ONSET DATE: ~3 weeks ago exacerbation, chronic pain in knee since Dec  SUBJECTIVE:   SUBJECTIVE STATEMENT: 8/13:  States feeling better.   Only c/o now is transferring from sit to stand from a low chair which is still bothersome.  However, overall she states she feels a lot better than when she started PT here 2 months ago  PERTINENT HISTORY: N/a  PAIN:  Are you having pain? Yes: NPRS scale: 2 today, at worst >10 Pain location: R medial anterior knee (adductor insertion), lateral quad Pain description: throbs Aggravating factors: Walking, stairs, transfers Relieving factors: Propped and bent,   PRECAUTIONS: None  RED FLAGS: None   WEIGHT BEARING RESTRICTIONS: No  FALLS:  Has patient fallen in last 6 months? Yes. 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  DIAGNOSTIC FINDINGS: MRI did show some degenerative meniscal fraying, dominant finding on my interpretation was significant chondromalacia and some areas near full-thickness under the patella. Pain is predominately patellofemoral today.  PATIENT SURVEYS:  LEFS: 39/80 = 48.7%  COGNITION: Overall cognitive status:  Within functional limits for tasks assessed     SENSATION: WFL  EDEMA:  Circumferential: 41 on L, 45 on R  MUSCLE LENGTH: Hamstrings: WNL  POSTURE: No Significant postural limitations  PALPATION: Highly tender to palpation R quad, adductors and ITB  LOWER EXTREMITY ROM:  Active ROM Right eval Left eval R 09/16/23 R 10/20/23  Hip flexion      Hip extension      Hip abduction      Hip adduction      Hip internal rotation      Hip external rotation      Knee flexion 105 * 125 128 125 AROM  Knee extension -13* 0 4 5 PROM 10 AROM   Ankle dorsiflexion      Ankle plantarflexion      Ankle inversion      Ankle eversion       (Blank rows = not tested, * = pain)  LOWER EXTREMITY MMT:  MMT Right eval Left eval R/L 09/29/23 12/01/23  RLE  Hip flexion 5 4+ 5/5 5  Hip extension 3+ 5 4-/4 4+  Hip abduction 3+ 3+ 4-/4 4  Hip adduction    3+  Hip internal rotation    5  Hip external rotation    5  Knee flexion 3+ * 5 4-/5 5  Knee extension 5 5 5/5 5  Ankle dorsiflexion      Ankle plantarflexion      Ankle inversion      Ankle eversion       (Blank rows = not tested, * = pain)  LOWER EXTREMITY SPECIAL TESTS:  Hip special tests: Debby test: negative and Ober's test: positive   FUNCTIONAL TESTS: (deferred due to pain eval day) 5 times sit to stand: 22 sec  SLS: TBA   GAIT: Distance walked: Into clinic Assistive device utilized: Crutches 1 crutch on R initially Level of assistance: Modified independence Comments: Antalgic, decreased knee extension and weightbearing on R. Cues to use crutch on L                                                                                                                                TREATMENT DATE:  12/01/23 THERAPEUTIC EXERCISE: To improve strength.  Demonstration, verbal and tactile cues throughout for technique. NuStep L6 x 8'  THERAPEUTIC ACTIVITIES: To improve functional performance.  Demonstration, verbal and tactile cues  throughout for technique. Standing calf stretch knee straight 1' x 2 RLE Standing soleus  stretch knee bent 1' x 2 RLE Step hang calf stretch x 1' x 2 BLE Seated ham stretch x 1' x 2 RLE Lateral Step downs 6 2/10 RLE Standing IT stretch x 1' x 2 RLE Up/down 1 flight steps reciprocally with intermittent handrail touch on 1 side  MANUAL THERAPY: To promote reduced pain utilizing myofascial release. MFR to R IT band--still fairly painful  SELF CARE: Provided education see below. Discussed need for lateral forefoot posting for forefoot valgus. Dispensed a lateral wedge and placed in R lateral toe box of her shoe---this helped her late phase supination considerably.  Discussed best ways to continue with gym exercises, water exercise, Nustep for strengthening, and HEP with focus on IT stretching/massage to maintain gains with PT.    11/22/23 THERAPEUTIC EXERCISE: To improve strength.  Demonstration, verbal and tactile cues throughout for technique. NuStep L5 x 7'  THERAPEUTIC ACTIVITIES: To improve functional performance.  Demonstration, verbal and tactile cues throughout for technique. Standing calf stretch knee straight 1' x 2 RLE Standing soleus stretch knee bent 1' x 2 RLE Step ups 6 forward and lateral x 2/5 RLE Step downs 6 2/5 RLE Seated knee extension 25# x 2/10 RLE Seated knee flexion 25#  x 2/10 RLE  NEUROMUSCULAR RE-EDUCATION: To improve balance. Sidestepping Blue TB x 2 laps down/back gym floor Monster walks GTB x 2 laps down gym floor and back Standing IT band stretch x 1' RLE Standing foam to wall IT self massage RLE Roller self IT massage  MANUAL THERAPY: To promote reduced pain utilizing therapeutic massage and myofascial release. MFR and STM to R IT band mid band to insertion    PATIENT EDUCATION:  Education details: IT stretching and self massage for better patellofemoral tracking Person educated: Patient Education method: Explanation, Demonstration, and  Handouts Education comprehension: verbalized understanding, returned demonstration, and needs further education  HOME EXERCISE PROGRAM: Access Code: [email or web address] URL: [email or web address] Date: 09/16/2023 Prepared by: Braylin Clark  Exercises - Modified Thomas Stretch  - 1 x daily - 7 x weekly - 2 sets - 1-2 min hold - Standing Hip Extension with Leg Bent and Support  - 1 x daily - 7 x weekly - 2 sets - 10 reps - Supine Hamstring Stretch with Strap  - 1 x daily - 7 x weekly - 2 sets - 2 reps - 30 sec hold - Supine ITB Stretch with Strap  - 1 x daily - 7 x weekly - 2 sets - 2 reps - 15-30 sec hold - Supine Quadriceps Stretch with Strap on Table  - 1 x daily - 7 x weekly - 2 sets - 2 reps - 30 sec hold - Seated Long Arc Quad  - 1 x daily - 3 x weekly - 2 sets - 10 reps - Clamshell with Resistance  - 1 x daily - 3 x weekly - 2 sets - 10 reps   ASSESSMENT:  CLINICAL IMPRESSION: Patient has been seen x 2 months PT for R knee OA.  She has made good progress and is ready for D/C.  She is transferring and ambulating independently with no device and reciprocal gait pattern on steps.  She has improved in strength, balance, RLE stability and greatly reduced pain. Still has some pain with low chair transfers, but this is expected with OA and she understands this.   She has met all goals for PT (see goals section below).    She is independent with her HEP and advised to continue with  this daily as tolerated and call us  with any further questions.  She verbalizes understanding and agreement.  She is pleased with her success.  D/C PT  OBJECTIVE IMPAIRMENTS: Abnormal gait, decreased activity tolerance, decreased balance, decreased endurance, decreased mobility, difficulty walking, decreased ROM, decreased strength, increased edema, increased fascial restrictions, increased muscle spasms, improper body mechanics, postural dysfunction, and pain.   ACTIVITY LIMITATIONS: carrying, lifting, sitting,  standing, squatting, sleeping, stairs, transfers, bed mobility, bathing, toileting, dressing, hygiene/grooming, locomotion level, and caring for others  PARTICIPATION LIMITATIONS: meal prep, cleaning, laundry, driving, shopping, community activity, and yard work  PERSONAL FACTORS: Age, Fitness, Past/current experiences, and Time since onset of injury/illness/exacerbation are also affecting patient's functional outcome.   REHAB POTENTIAL: Good  CLINICAL DECISION MAKING: Evolving/moderate complexity  EVALUATION COMPLEXITY: Moderate   GOALS: Goals reviewed with patient? Yes  SHORT TERM GOALS: Target date: 09/22/2023  Pt will be ind with initial HEP Baseline: Goal status: MET- 10/05/23  2.  Pt will demo improved R knee ROM from 5-110 deg for amb Baseline:  Goal status: MET- 09/16/23   LONG TERM GOALS: NEW Target date: 12/01/2023   Pt will be ind with management and progression of HEP Baseline:  Goal status: 10/20/23 PROGRESSING  2.  Pt will have improved knee ROM from 0 to 120 deg for stair negotiation Baseline:  10/20/23: 5 to 125 deg  Goal status: MET  3.  Pt will be able to ascend/descend stairs in normal reciprocal pattern for improved home mobility Baseline:  10/05/23 see treatment 10/20/23: reciprocal pattern going up, fatigues and lacks control with descent but able to perform reciprocally Goal status: IN PROGRESS  4.  Pt will be able to walk independently x 1000' to access community Baseline:  10/20/23: Gets stiff with walking but otherwise able to perform without crutches Goal status: MET  5.  Pt will demo 5 x STS of </=13 sec for decreased fall risk Baseline: 10/13/23: 22 sec 11/15/23:  16.33 SEC 12/01/23:  12.34 SEC Goal status:  MET   6.  Pt will have improved LEFS to >/=48/80 to demo MCID Baseline: LEFS: 39/80 = 48.7% 10/20/23: Lower Extremity Functional Score: 37 / 80 = 46.3 % 12/01/23:  57/80 Goal status: MET  PLAN:  PT FREQUENCY: 1-2x/week  PT DURATION: 6  weeks  PLANNED INTERVENTIONS: 97164- PT Re-evaluation, 97750- Physical Performance Testing, 97110-Therapeutic exercises, 97530- Therapeutic activity, 97112- Neuromuscular re-education, 97535- Self Care, [id number]- Manual therapy, [id number]- Gait training, [phone]- Aquatic Therapy, [phone]- Electrical stimulation (unattended), [phone]- Ionotophoresis 4mg /ml Dexamethasone, Patient/Family education, Balance training, Stair training, Taping, Dry Needling, Joint mobilization, Spinal mobilization, Cryotherapy, and Moist heat  PLAN FOR NEXT SESSION:   d/c PT.    RED SENIOR, PT, DPT 12/01/2023, 3:25 PM  PHYSICAL THERAPY DISCHARGE SUMMARY  Visits from Start of Care: 12  Current functional level related to goals / functional outcomes: 4+ to 5/5 strength BLE except for hip ADDuctors;  independent steps with reciprocal pattern; LEFS 57/80   Remaining deficits: Intermittent R knee pain with transferring from low chairs   Education / Equipment: Independent with HEP.  Provided w/ a lateral forefoot wedge R shoe  Patient agrees to discharge. Patient goals were met. Patient is being discharged due to meeting the stated rehab goals.

## 2023-12-07 ENCOUNTER — Ambulatory Visit: Payer: BC Managed Care – PPO | Admitting: Physician Assistant

## 2023-12-07 VITALS — BP 130/80 | HR 76 | Ht 70.0 in | Wt 246.0 lb

## 2023-12-07 DIAGNOSIS — Z13220 Encounter for screening for lipoid disorders: Secondary | ICD-10-CM

## 2023-12-07 DIAGNOSIS — E66812 Obesity, class 2: Secondary | ICD-10-CM

## 2023-12-07 DIAGNOSIS — E063 Autoimmune thyroiditis: Secondary | ICD-10-CM | POA: Diagnosis not present

## 2023-12-07 DIAGNOSIS — Z0000 Encounter for general adult medical examination without abnormal findings: Secondary | ICD-10-CM

## 2023-12-07 DIAGNOSIS — G720 Drug-induced myopathy: Secondary | ICD-10-CM

## 2023-12-07 DIAGNOSIS — Z1211 Encounter for screening for malignant neoplasm of colon: Secondary | ICD-10-CM

## 2023-12-07 DIAGNOSIS — Z131 Encounter for screening for diabetes mellitus: Secondary | ICD-10-CM

## 2023-12-07 DIAGNOSIS — E559 Vitamin D deficiency, unspecified: Secondary | ICD-10-CM

## 2023-12-07 DIAGNOSIS — E785 Hyperlipidemia, unspecified: Secondary | ICD-10-CM | POA: Diagnosis not present

## 2023-12-07 DIAGNOSIS — R7303 Prediabetes: Secondary | ICD-10-CM

## 2023-12-07 DIAGNOSIS — Z6835 Body mass index (BMI) 35.0-35.9, adult: Secondary | ICD-10-CM

## 2023-12-07 MED ORDER — LEVOTHYROXINE SODIUM 150 MCG PO TABS: 150.0000 ug | ORAL_TABLET | Freq: Every day | ORAL | 3 refills | Status: AC

## 2023-12-07 NOTE — Patient Instructions (Addendum)
 Berberine for sugar control 500mg  3 time a day before meals  a day of K2 to help with vitamin D  absorption  Discussed repatha to consider  Evolocumab Injection What is this medication? EVOLOCUMAB (e voe LOK ue mab) treats high cholesterol. It may also be used to lower the risk of heart attack, stroke, worsening chest pain (unstable angina), and a type of heart surgery. It works by decreasing bad cholesterol (such as LDL) in your blood. It is a monoclonal antibody. Changes to diet and exercise are often combined with this medication. This medicine may be used for other purposes; ask your health care provider or pharmacist if you have questions. COMMON BRAND NAME(S): Repatha, Repatha SureClick What should I tell my care team before I take this medication? They need to know if you have any of these conditions: An unusual or allergic reaction to evolocumab, latex, other medications, foods, dyes, or preservatives Pregnant or trying to get pregnant Breast-feeding How should I use this medication? This medication is injected under the skin. You will be taught how to prepare and give it. Take it as directed on the prescription label at the same time every day. Keep taking it unless your care team tells you to stop. It is important that you put your used needles and syringes in a special sharps container. Do not put them in a trash can. If you do not have a sharps container, call your pharmacist or care team to get one. This medication comes with INSTRUCTIONS FOR USE. Ask your pharmacist for directions on how to use this medication. Read the information carefully. Talk to your pharmacist or care team if you have questions. Talk to your care team about the use of this medication in children. While it may be prescribed for children as young as 10 years for selected conditions, precautions do apply. Overdosage: If you think you have taken too much of this medicine contact a poison control center or  emergency room at once. NOTE: This medicine is only for you. Do not share this medicine with others. What if I miss a dose? It is important not to miss any doses. Talk to your care team about what to do if you miss a dose. What may interact with this medication? Interactions are not expected. This list may not describe all possible interactions. Give your health care provider a list of all the medicines, herbs, non-prescription drugs, or dietary supplements you use. Also tell them if you smoke, drink alcohol, or use illegal drugs. Some items may interact with your medicine. What should I watch for while using this medication? Visit your care team for regular checks on your progress. Tell your care team if your symptoms do not start to get better or if they get worse. You may need blood work while you are taking this medication. Do not wear the on-body infuser during an MRI. Taking this medication is only part of a total heart healthy program. Ask your care team if there are other changes you can make to improve your overall health. What side effects may I notice from receiving this medication? Side effects that you should report to your care team as soon as possible: Allergic reactions or angioedema--skin rash, itching or hives, swelling of the face, eyes, lips, tongue, arms, or legs, trouble swallowing or breathing Side effects that usually do not require medical attention (report to your care team if they continue or are bothersome): Back pain Flu-like symptoms--fever, chills, muscle pain, cough, headache,  fatigue Pain, redness, or irritation at injection site Runny or stuffy nose Sore throat This list may not describe all possible side effects. Call your doctor for medical advice about side effects. You may report side effects to FDA at 1-800-FDA-1088. Where should I keep my medication? Keep out of the reach of children and pets. Store in a refrigerator or at room temperature between 20 and  25 degrees C (68 and 77 degrees F). Refrigeration (preferred): Store it in the refrigerator. Do not freeze. Keep it in the original carton until you are ready to take it. Remove the dose from the carton about 30 minutes before it is time for you to take it. Get rid of any unused medication after the expiration date. Room temperature: This medication may be stored at room temperature for up to 30 days. Keep it in the original carton until you are ready to take it. If it is stored at room temperature, get rid of any unused medication after 30 days or after it expires, whichever is first. Protect from light. Do not shake. Avoid exposure to extreme heat. To get rid of medications that are no longer needed or have expired: Take the medication to a medication take-back program. Check with your pharmacy or law enforcement to find a location. If you cannot return the medication, ask your pharmacist or care team how to get rid of this medication safely. NOTE: This sheet is a summary. It may not cover all possible information. If you have questions about this medicine, talk to your doctor, pharmacist, or health care provider.  2025 Elsevier/Gold Standard (2023-05-24 00:00:00)   Health Maintenance, Female Adopting a healthy lifestyle and getting preventive care are important in promoting health and wellness. Ask your health care provider about: The right schedule for you to have regular tests and exams. Things you can do on your own to prevent diseases and keep yourself healthy. What should I know about diet, weight, and exercise? Eat a healthy diet  Eat a diet that includes plenty of vegetables, fruits, low-fat dairy products, and lean protein. Do not eat a lot of foods that are high in solid fats, added sugars, or sodium. Maintain a healthy weight Body mass index (BMI) is used to identify weight problems. It estimates body fat based on height and weight. Your health care provider can help determine your  BMI and help you achieve or maintain a healthy weight. Get regular exercise Get regular exercise. This is one of the most important things you can do for your health. Most adults should: Exercise for at least 150 minutes each week. The exercise should increase your heart rate and make you sweat (moderate-intensity exercise). Do strengthening exercises at least twice a week. This is in addition to the moderate-intensity exercise. Spend less time sitting. Even light physical activity can be beneficial. Watch cholesterol and blood lipids Have your blood tested for lipids and cholesterol at 58 years of age, then have this test every 5 years. Have your cholesterol levels checked more often if: Your lipid or cholesterol levels are high. You are older than 58 years of age. You are at high risk for heart disease. What should I know about cancer screening? Depending on your health history and family history, you may need to have cancer screening at various ages. This may include screening for: Breast cancer. Cervical cancer. Colorectal cancer. Skin cancer. Lung cancer. What should I know about heart disease, diabetes, and high blood pressure? Blood pressure and heart disease High  blood pressure causes heart disease and increases the risk of stroke. This is more likely to develop in people who have high blood pressure readings or are overweight. Have your blood pressure checked: Every 3-5 years if you are 45-30 years of age. Every year if you are 67 years old or older. Diabetes Have regular diabetes screenings. This checks your fasting blood sugar level. Have the screening done: Once every three years after age 73 if you are at a normal weight and have a low risk for diabetes. More often and at a younger age if you are overweight or have a high risk for diabetes. What should I know about preventing infection? Hepatitis B If you have a higher risk for hepatitis B, you should be screened for this  virus. Talk with your health care provider to find out if you are at risk for hepatitis B infection. Hepatitis C Testing is recommended for: Everyone born from [birth year] through 1965. Anyone with known risk factors for hepatitis C. Sexually transmitted infections (STIs) Get screened for STIs, including gonorrhea and chlamydia, if: You are sexually active and are younger than 58 years of age. You are older than 58 years of age and your health care provider tells you that you are at risk for this type of infection. Your sexual activity has changed since you were last screened, and you are at increased risk for chlamydia or gonorrhea. Ask your health care provider if you are at risk. Ask your health care provider about whether you are at high risk for HIV. Your health care provider may recommend a prescription medicine to help prevent HIV infection. If you choose to take medicine to prevent HIV, you should first get tested for HIV. You should then be tested every 3 months for as long as you are taking the medicine. Pregnancy If you are about to stop having your period (premenopausal) and you may become pregnant, seek counseling before you get pregnant. Take 400 to 800 micrograms (mcg) of folic acid every day if you become pregnant. Ask for birth control (contraception) if you want to prevent pregnancy. Osteoporosis and menopause Osteoporosis is a disease in which the bones lose minerals and strength with aging. This can result in bone fractures. If you are 64 years old or older, or if you are at risk for osteoporosis and fractures, ask your health care provider if you should: Be screened for bone loss. Take a calcium  or vitamin D  supplement to lower your risk of fractures. Be given hormone replacement therapy (HRT) to treat symptoms of menopause. Follow these instructions at home: Alcohol use Do not drink alcohol if: Your health care provider tells you not to drink. You are pregnant, may be pregnant,  or are planning to become pregnant. If you drink alcohol: Limit how much you have to: 0-1 drink a day. Know how much alcohol is in your drink. In the [place] one drink equals one 12 oz bottle of beer (355 mL), one 5 oz glass of wine (148 mL), or one 1 oz glass of hard liquor (44 mL). Lifestyle Do not use any products that contain nicotine or tobacco. These products include cigarettes, chewing tobacco, and vaping devices, such as e-cigarettes. If you need help quitting, ask your health care provider. Do not use street drugs. Do not share needles. Ask your health care provider for help if you need support or information about quitting drugs. General instructions Schedule regular health, dental, and eye exams. Stay current with your vaccines. Tell  your health care provider if: You often feel depressed. You have ever been abused or do not feel safe at home. Summary Adopting a healthy lifestyle and getting preventive care are important in promoting health and wellness. Follow your health care provider's instructions about healthy diet, exercising, and getting tested or screened for diseases. Follow your health care provider's instructions on monitoring your cholesterol and blood pressure. This information is not intended to replace advice given to you by your health care provider. Make sure you discuss any questions you have with your health care provider. Document Revised: 08/26/2020 Document Reviewed: 08/26/2020 Elsevier Patient Education  2024 [employer].

## 2023-12-07 NOTE — Progress Notes (Unsigned)
 Complete physical exam  Patient: Katrina Clements   DOB: January 26, 1966   58 y.o. Female  MRN: [id number]  Subjective:    Chief Complaint  Patient presents with  . Annual Exam    Denetria Simkin is a 58 y.o. female who presents today for a complete physical exam. She reports consuming a {diet types:17450} diet. {types:19826} She generally feels {DESC; WELL/FAIRLY WELL/POORLY:18703}. She reports sleeping {DESC; WELL/FAIRLY WELL/POORLY:18703}. She {does/does not:200015} have additional problems to discuss today.    Most recent fall risk assessment:    11/28/2021    8:15 AM  Fall Risk   Falls in the past year? 1  Number falls in past yr: 1  Injury with Fall? 1  Risk for fall due to : No Fall Risks  Follow up Falls evaluation completed      Data saved with a previous flowsheet row definition     Most recent depression screenings:    12/04/2022    8:31 AM 11/28/2021    8:15 AM  PHQ 2/9 Scores  PHQ - 2 Score 1 0  PHQ- 9 Score 2     {VISON DENTAL STD PSA (Optional):27386}  {History (Optional):23778}  Patient Care Team: Jeananne Bedwell L, PA-C as PCP - General (Family Medicine) Tobb, Kardie, DO as PCP - Cardiology (Cardiology)   Outpatient Medications Prior to Visit  Medication Sig  . Bempedoic Acid-Ezetimibe (NEXLIZET ) 180-10 MG TABS Take 1 tablet by mouth daily. Take 180 mg by mouth daily.  . fluticasone  (FLONASE ) 50 MCG/ACT nasal spray Place 1 spray into both nostrils as needed for allergies or rhinitis.  SABRA ketotifen (ZADITOR) 0.025 % ophthalmic solution 1 drop 2 (two) times daily.  . levothyroxine  (SYNTHROID ) 137 MCG tablet TAKE 1 TABLET BY MOUTH DAILY BEFORE BREAKFAST.  . liothyronine  (CYTOMEL ) 5 MCG tablet TAKE 1 TABLET BY MOUTH EVERY DAY  . meloxicam  (MOBIC ) 15 MG tablet One tab PO every 24 hours with a meal for 2 weeks, then once every 24 hours prn pain.  . Vitamin D , Ergocalciferol , (DRISDOL ) 1.25 MG (50000 UNIT) CAPS capsule TAKE 1 CAPSULE EVERY 7 DAYS   No  facility-administered medications prior to visit.    ROS        Objective:     BP 130/80   Pulse 76   Ht 5' 10 (1.778 m)   Wt 246 lb (111.6 kg)   BMI 35.30 kg/m  {Vitals History (Optional):23777}  Physical Exam   No results found for any visits on 12/07/23. {Show previous labs (optional):23779}    Assessment & Plan:    Routine Health Maintenance and Physical Exam  Immunization History  Administered Date(s) Administered  . Influenza,inj,Quad PF,6+ Mos 12/08/2013, 03/26/2014, 02/28/2016, 05/08/2018, 05/09/2020  . Moderna Sars-Covid-2 Vaccination 07/24/2019, 08/21/2019  . PFIZER(Purple Top)SARS-COV-2 Vaccination 04/14/2020  . Tdap 05/12/2018  . Zoster Recombinant(Shingrix ) 11/27/2020, 11/28/2021    Health Maintenance  Topic Date Due  . HIV Screening  Never done  . Hepatitis C Screening  Never done  . Hepatitis B Vaccines 19-59 Average Risk (1 of 3 - 19+ 3-dose series) Never done  . Pneumococcal Vaccine: 50+ Years (1 of 1 - PCV) Never done  . Fecal DNA (Cologuard)  05/11/2021  . COVID-19 Vaccine (4 - 2024-25 season) 12/20/2022  . INFLUENZA VACCINE  11/19/2023  . MAMMOGRAM  01/02/2024  . DTaP/Tdap/Td (2 - Td or Tdap) 05/12/2028  . Zoster Vaccines- Shingrix   Completed  . HPV VACCINES  Aged Out  . Meningococcal B Vaccine  Aged Out  Discussed health benefits of physical activity, and encouraged her to engage in regular exercise appropriate for her age and condition.  Problem List Items Addressed This Visit   None Visit Diagnoses       Routine physical examination    -  Primary      No follow-ups on file.     Shy Guallpa, PA-C

## 2023-12-10 ENCOUNTER — Encounter: Payer: Self-pay | Admitting: Physician Assistant

## 2023-12-10 DIAGNOSIS — G720 Drug-induced myopathy: Secondary | ICD-10-CM | POA: Insufficient documentation

## 2023-12-10 DIAGNOSIS — R7303 Prediabetes: Secondary | ICD-10-CM | POA: Insufficient documentation

## 2023-12-21 ENCOUNTER — Encounter: Payer: Self-pay | Admitting: Sports Medicine

## 2024-05-18 ENCOUNTER — Other Ambulatory Visit: Payer: Self-pay | Admitting: Physician Assistant

## 2024-05-18 DIAGNOSIS — E063 Autoimmune thyroiditis: Secondary | ICD-10-CM

## 2024-05-18 NOTE — Telephone Encounter (Signed)
 Requesting rx rf of Liothyronine  Last written 12/03/2023 a 90 day with one refill  Last OV 12/07/2023- Last TSH 11/08/2023 Also showing levothyroxine  in chart Upcoming appt 02/20/226

## 2024-06-09 ENCOUNTER — Ambulatory Visit: Admitting: Physician Assistant
